# Patient Record
Sex: Female | Born: 1970 | Race: White | Hispanic: No | Marital: Married | Smoking: Never smoker
Health system: Southern US, Community
[De-identification: ages and names within clinical notes are randomized; demographics above are authoritative.]

## PROBLEM LIST (undated history)

## (undated) DIAGNOSIS — T8859XA Other complications of anesthesia, initial encounter: Secondary | ICD-10-CM

---

## 1999-07-09 ENCOUNTER — Other Ambulatory Visit: Admission: RE | Admit: 1999-07-09 | Discharge: 1999-07-09 | Payer: Self-pay | Admitting: Obstetrics and Gynecology

## 1999-09-16 ENCOUNTER — Inpatient Hospital Stay (HOSPITAL_COMMUNITY): Admission: AD | Admit: 1999-09-16 | Discharge: 1999-09-16 | Payer: Self-pay | Admitting: Obstetrics and Gynecology

## 1999-11-01 ENCOUNTER — Other Ambulatory Visit: Admission: RE | Admit: 1999-11-01 | Discharge: 1999-11-01 | Payer: Self-pay | Admitting: Obstetrics and Gynecology

## 2000-01-29 ENCOUNTER — Inpatient Hospital Stay (HOSPITAL_COMMUNITY): Admission: AD | Admit: 2000-01-29 | Discharge: 2000-02-02 | Payer: Self-pay | Admitting: Obstetrics and Gynecology

## 2000-02-03 ENCOUNTER — Encounter: Admission: RE | Admit: 2000-02-03 | Discharge: 2000-04-25 | Payer: Self-pay | Admitting: Obstetrics and Gynecology

## 2000-03-05 ENCOUNTER — Other Ambulatory Visit: Admission: RE | Admit: 2000-03-05 | Discharge: 2000-03-05 | Payer: Self-pay | Admitting: Obstetrics and Gynecology

## 2001-08-27 ENCOUNTER — Other Ambulatory Visit: Admission: RE | Admit: 2001-08-27 | Discharge: 2001-08-27 | Payer: Self-pay | Admitting: Obstetrics and Gynecology

## 2002-12-09 ENCOUNTER — Other Ambulatory Visit: Admission: RE | Admit: 2002-12-09 | Discharge: 2002-12-09 | Payer: Self-pay | Admitting: Obstetrics and Gynecology

## 2003-05-26 ENCOUNTER — Inpatient Hospital Stay (HOSPITAL_COMMUNITY): Admission: AD | Admit: 2003-05-26 | Discharge: 2003-05-26 | Payer: Self-pay | Admitting: Obstetrics and Gynecology

## 2003-07-01 ENCOUNTER — Inpatient Hospital Stay (HOSPITAL_COMMUNITY): Admission: RE | Admit: 2003-07-01 | Discharge: 2003-07-04 | Payer: Self-pay | Admitting: Obstetrics and Gynecology

## 2003-12-01 ENCOUNTER — Other Ambulatory Visit: Admission: RE | Admit: 2003-12-01 | Discharge: 2003-12-01 | Payer: Self-pay | Admitting: Obstetrics and Gynecology

## 2004-11-30 ENCOUNTER — Other Ambulatory Visit: Admission: RE | Admit: 2004-11-30 | Discharge: 2004-11-30 | Payer: Self-pay | Admitting: Obstetrics and Gynecology

## 2005-12-20 ENCOUNTER — Other Ambulatory Visit: Admission: RE | Admit: 2005-12-20 | Discharge: 2005-12-20 | Payer: Self-pay | Admitting: Obstetrics and Gynecology

## 2007-05-17 ENCOUNTER — Inpatient Hospital Stay (HOSPITAL_COMMUNITY): Admission: AD | Admit: 2007-05-17 | Discharge: 2007-05-17 | Payer: Self-pay | Admitting: Obstetrics and Gynecology

## 2007-07-09 ENCOUNTER — Inpatient Hospital Stay (HOSPITAL_COMMUNITY): Admission: RE | Admit: 2007-07-09 | Discharge: 2007-07-12 | Payer: Self-pay | Admitting: Obstetrics and Gynecology

## 2011-02-26 NOTE — Discharge Summary (Signed)
[name], [name]              ACCOUNT NO.:  [id number]   MEDICAL RECORD NO.:  [id number]          PATIENT TYPE:  INP   LOCATION:  9124                          FACILITY:  [facility]   PHYSICIAN:  Janine Limbo, M.D.DATE OF BIRTH:  1971-07-23   DATE OF ADMISSION:  07/09/2007  DATE OF DISCHARGE:  07/12/2007                               DISCHARGE SUMMARY   ADMITTING DIAGNOSES:  1. Intrauterine pregnancy at 53 and 5/7 weeks.  2. Previous Cesarean section x2 with desire for repeat.   DISCHARGE DIAGNOSES:  1. Intrauterine pregnancy at 13 and 5/7 weeks.  2. Previous Cesarean section x2 with desire for repeat.  3. Including incidental cystotomy.   PROCEDURES:  1. Repeat low transverse Cesarean section.  2. Repair of incidental cystotomy.  3. Spinal anesthesia.   HOSPITAL COURSE:  The patient is a 40 year old gravida 3, para 2-0-0-2  at 66 and 5/7 weeks who presented for an elective Cesarean section  secondary to previous C section x2. Pregnancy had been remarkable for 1)  questionable LMP, 2) previous C section x2, 3) HSV, 4) irritable bowel  syndrome, 5) migraines, 6) frequent UTIs, 7) obesity.   The patient was taken to the operating room. A repeat low transverse  Cesarean section was performed by Dr. Estanislado Pandy. There was an incidental  cystotomy that occurred at the time of the surgery. This was repaired by  Dr. Estanislado Pandy and Dr. Pennie Rushing. Findings were a viable female by the name of  [name]. Apgars were 9 and 9, weight was 6 pounds 3 ounces. The  patient did donate her cord blood to the [facility]. The  patient tolerated the procedure well and was taken to the recovery in  good  condition. The patient was taken to the full-term nursery.   By postop day 1 the patient was doing well. She did have some gaseous  distention, but this did improve with ambulation. The Foley was to  remain in place secondary to the incidental cystotomy and was to be  removed on  07/16/07, at 10:30 a.m. in the office. The patient did have  some jaw pain secondary to probable clenching. She had a JP drain that  had about 30 mL in it but was within normal limits. She was doing well  with pain management.   By postop day 2 the patient continued to do well. She was up ad lib.  Pain medication was doing well. Her hemoglobin was 10.8, down from 12.1.  white blood cell count was 15.7 and platelet count was 249. Her physical  exam was within normal limits. Her Foley was draining clear yellow  urine.   Late in the evening of 9/27, the nurse midwife was called to see the  patient secondary to bilateral breast redness and pain. She had no  fever. There was possible early mastitis noted. A decision was made to  start antibiotics and dicloxacillin was begun that evening.   By postop day 3 the patient was doing well. She was up ad lib. Her  breasts were slightly tender but there was minimal erythema noted. They  were full, slightly engorged. Her incision was clean, dry and intact.  Her JP drain was removed without difficulty by Dr. Stefano Gaul. She was  deemed to have received full benefit of her hospital stay and was  discharged home.   DISCHARGE INSTRUCTIONS:  Presented [facility] [facility] handout. The patient is  to also maintain the Foley until removal in the office on 07/16/07, at  10:30 a.m.   DISCHARGE MEDICATIONS:  1. Motrin 600 mg orally q. 6 hour p.r.n., pain.  2. Percocet 5/325 one to two orally q. 3 to 4 hours p.r.n., pain.  3. Dicloxacillin 500 mg 1 orally q. 6 hours x10 days.  4. The patient will decide regarding birth control.  5. Prenatal vitamin 1 orally daily.   DISCHARGE FOLLOWUP:  Will occur on 07/16/07, at 10:30 a.m. with Dr.  Estanislado Pandy for removal of the Foley catheter, 6 weeks post partum, and then  will be the 6-week postop C section will be the routine followup or  p.r.n.      [name], C.N.M.      Janine Limbo, M.D.  Electronically  Signed    VLL/MEDQ  D:  07/12/2007  T:  07/12/2007  Job:  [id number]

## 2011-02-26 NOTE — Op Note (Signed)
NAMEBALJIT, [name]              ACCOUNT NO.:  [id number]   MEDICAL RECORD NO.:  [id number]          PATIENT TYPE:  INP   LOCATION:  9124                          [facility]   PHYSICIAN:  Crist Fat. Rivard, M.D. DATE OF BIRTH:  12/09/1970   DATE OF PROCEDURE:  07/09/2007  DATE OF DISCHARGE:                               OPERATIVE REPORT   PREOPERATIVE DIAGNOSIS:  Intrauterine pregnancy at 38 weeks and 5 days  with two previous cesarean sections.   POSTOP DIAGNOSIS:  Intrauterine pregnancy at 38 weeks and 5 days with  two previous cesarean sections with incidental cystostomy.   ANESTHESIA:  Spinal, Germaine Pomfret, M.D.   PROCEDURE:  Repeat low-transverse cesarean section with repair of  incidental cystostomy.   SURGEON:  Crist Fat. Rivard, M.D.   ASSISTANT:  Maris Berger. Haygood, M.D.   ESTIMATED BLOOD LOSS:  800 mL.   DESCRIPTION OF PROCEDURE:  After being informed of the planned procedure  with possible complications including bleeding, infection, injury to  bladder, bowels or ureter, informed consent is obtained.  The patient is  taken to cesarean suite, given spinal anesthesia without any  complication.  She is placed in the dorsal decubitus position, pelvis  tilted to the left, prepped and draped in a sterile fashion, and a Foley  catheter is inserted in her bladder.   After assessing adequate level of anesthesia, we infiltrated the  previous Pfannenstiel incision using 20 mL of Marcaine 0.25, and we  performed a Pfannenstiel incision which was brought down sharply to the  fascia.  The fascia was incised in a low-transverse fashion.  Linea alba  was dissected.  Peritoneum was entered in the midline fashion.  Visceral  peritoneum was entered in a low-transverse fashion, allowing [place] to  safely retract bladder by developing a bladder flap.  We then entered  the myometrium with knife in a low-transverse fashion, and then we  extended bluntly.  Amniotic fluid was  clear.  We assist the birth of a  female infant at 1405 mouth and nose were suctioned, and the baby is  delivered while reducing a nuchal cord.   The cord is clamped with two Kelly clamps and sectioned, and the baby is  given to Dr. Francine Graven, neonatologist, present in the room.  The  placenta is allowed to deliver spontaneously.  It was complete, the cord  has 3 vessels, and uterine revision was negative.  The placenta was sent  for cord blood donation, and the patient received a dose of Ancef 2  grams IV.   We then proceeded with closure of the myometrium, first with a running  lock suture of #0 Vicryl, then with a Lembert suture of #0 Vicryl,  imbricating three-quarters of the incision.  This second layer had to be  stopped about one 2 cm away from the right angle, due to a finding of a  incidental cystostomy near the myometrial incision.   This cystostomy is 0.5 cm in diameter and we are able to visualize the  internal aspect of it which is not anywhere near the urethral opening.  Using  a 3-0 Vicryl we closed the cystostomy in two layers, first with a  running suture, then with interrupted suture of 3-0 Vicryl.   The bladder was then filled with sterile formula to see that the repair  is intact.  Hemostasis was completed on the myometrial incision, near  the left angle, with a figure-of-eight stitch of #0 Vicryl.  We then  irrigate profusely with warm saline, and note a satisfactory hemostasis.  Both tubes and both ovaries are assessed and adequate.   Under fascia hemostasis was completed with cautery, and the fascia was  then closed with two running sutures of #1 Vicryl meeting midline.  The  wound was irrigated with warm saline.  A #10 Jackson-Pratt was left in  the incision with the left counter incision sutured with a #0 silk.  The  skin was closed with subcuticular suture of 3-0 Monocryl and Steri-  Strips.   Instruments and sponge count was complete x2.  Estimated blood  loss was  800 mL.  The procedure was very well tolerated by the patient who was  taken to the recovery room in a well and stable condition.   Little girl, named [name], was born at 2:05 p.m. received an  Apgar of 9 at one minute and 9 at five minutes, and weighs 6 pounds 3  ounces.   SPECIMEN:  Placenta sent to cord blood donation then to labor and  delivery.      Crist Fat Rivard, M.D.  Electronically Signed     SAR/MEDQ  D:  07/09/2007  T:  07/10/2007  Job:  [id number]

## 2011-02-26 NOTE — H&P (Signed)
[name], [name]              ACCOUNT NO.:  [id number]   MEDICAL RECORD NO.:  [id number]          PATIENT TYPE:  INP   LOCATION:  9124                          [facility]   PHYSICIAN:  Hannah Fat. [name], M.D. DATE OF BIRTH:  19-Apr-1971   DATE OF ADMISSION:  07/09/2007  DATE OF DISCHARGE:                              HISTORY & PHYSICAL   HISTORY AND PHYSICAL:  This is a 40 year old, G3, P2-0-0-2 at 38-5/7  weeks who presents for an elective repeat cesarean section secondary to  previous C-section x2.  Pregnancy has been followed by Dr. Estanislado Pandy and  remarkable for:  1. Unsure LMP.  2. Previous C-section x2.  3. HSV.  4. IBS.  5. Migraines.  6. Frequent UTIs.  7. Obesity.   ALLERGIES:  CODEINE causes loopy feeling.   PAST OBSTETRICAL HISTORY:  The patient had a C-section delivery in 2001,  of a female infant at 31 weeks' gestation weighing 8 pounds 2 ounces  remarkable for decelerations.  She had a C-section delivery in 2004, of  a female infant at 67 weeks' gestation weighing 7 pounds 2 ounces with no  complications.   PAST MEDICAL HISTORY:  1. History of anemia during pregnancy.  2. Blood type is Rh negative.  3. History of HSV.  4. Childhood varicella.  5. History of IBS and frequent bladder infections.  6. The patient was a previous smoker, but quit 10 years ago.   FAMILY HISTORY:  Remarkable for grandfather and sister with  varicosities.  Sister and grandmother with diabetes.  Father with colon  cancer.   GENETIC HISTORY:  Remarkable for a niece with cerebral palsy and a  cousin with Down syndrome.   SOCIAL HISTORY:  The patient is married to [name] who is involved  and supportive.  She does not report a religious affiliation.  She is a  stay at home mom.  She denies any alcohol, tobacco or drug use.   PRENATAL LABORATORY DATA:  Hemoglobin 12.9, platelets 385.  Blood type A  negative, antibody screen negative, RPR nonreactive, rubella immune.  Hepatitis  negative.  HIV negative.  Pap test normal.  Gonorrhea  negative, chlamydia negative.  First trimester screen normal.  Cystic  fibrosis declined.   PRENATAL COURSE:  The patient entered care at [redacted] weeks gestation.  First  trimester screen was normal.  She had an ultrasound at 19 weeks that was  normal, but they were unable to see heart anatomy and she had another  ultrasound at 21 weeks to complete that.  She had a Glucola at 28 weeks  and got RhoGAM at 28 weeks.  Glucola was normal.  She presents today for  C-section.   PHYSICAL EXAMINATION:  VITAL SIGNS:  Stable, afebrile.  HEENT:  Within normal limits.  NECK:  Thyroid normal, not enlarged.  CHEST:  Clear to auscultation.  HEART:  Regular rate and rhythm.  ABDOMEN:  Gravid, vertex to Leopold's.  Cervix deferred.  EXTREMITIES:  Within normal limits.   ASSESSMENT:  1. Intrauterine pregnancy at 38-5/7 weeks.  2. Previous C-section x2.  3. Desires repeat  C-section.   PLAN:  1. Admit to operating room per Dr. Estanislado Pandy.  2. Further orders to follow.      Marie L. Williams, C.N.M.      [name], M.D.  Electronically Signed    MLW/MEDQ  D:  07/09/2007  T:  07/10/2007  Job:  [id number]

## 2011-03-01 NOTE — Op Note (Signed)
[name], [name]                        ACCOUNT NO.:  [id number]   MEDICAL RECORD NO.:  [id number]                   PATIENT TYPE:  INP   LOCATION:  9137                                 [facility]   PHYSICIAN:  Crist Fat. Rivard, M.D.              DATE OF BIRTH:  1971/01/09   DATE OF PROCEDURE:  07/01/2003  DATE OF DISCHARGE:                                 OPERATIVE REPORT   PREOPERATIVE DIAGNOSES:  1. Intrauterine pregnancy at 39 weeks'.  2. Previous cesarean section.   POSTOPERATIVE DIAGNOSES:  1. Intrauterine pregnancy at 39 weeks'.  2. Previous cesarean section.   ANESTHESIA:  Spinal.   ANESTHESIOLOGIST:  Dr. Jean Rosenthal.   PROCEDURE:  Repeat low transverse cesarean section.   SURGEON:  Crist Fat. Rivard, M.D.   ASSISTANT:  Elby Showers. Williams, C.N.M.   ESTIMATED BLOOD LOSS:  700 mL.   PROCEDURE:  After being informed of the planned procedures with possible  complications including bleeding, infection, injury to bowels, bladder, or  ureters, informed consent was obtained.  The patient is taken to cesarean  suite and given spinal anesthesia without any complication.  She is prepped  and draped in a sterile fashion and a Foley catheter is inserted in her  bladder.  She is placed in the dorsal decubitus position, pelvis tilted to  the left.   After assessing adequate level of anesthesia, the previous incision is  infiltrated with 20 mL of 0.25% Marcaine and we proceed with the  Pfannenstiel incision which is brought down to the fascia.  The fascia is  incised in a low transverse fashion, the linea alba is dissected, peritoneum  is entered in a midline fashion.  The visceral peritoneum is entered in a  low transverse fashion allowing [place] to safely retract bladder by developing a  bladder flap.  Myometrium is then entered in a low transverse fashion, first  with knife and then bluntly.  Amniotic fluid is clear.  We assist the birth  of a female infant with vacuum  assistance in vertex presentation at 9:45 a.m.  Mouth and nose are suctioned, baby is delivered, cord is clamped with two  Kelly clamps and sectioned, and the baby is given to the pediatrician  present in the room.  Twenty mL of blood is drawn from the umbilical vein  and the placenta is allowed to delivery spontaneously.  It is complete and  cord has three vessels.  Uterine revision is negative.  We then proceed with  closure of the myometrium in two layers, first with a running lock suture of  0 Vicryl, then with a Lembert suture of 0 Vicryl covering the first one.  Hemostasis is completed at each angle with two figure-of-eight stitches of 0  Vicryl.  Hemostasis is assessed and adequate.  Both paracolic gutters are  cleansed.  Both tubes and ovaries assessed and normal.  The pelvis is  irrigated with  warm saline and hemostasis is adequate.  Under fascia  hemostasis is completed with cautery and the fascia is closed with two  running sutures of 0 Vicryl meeting midline.  Wound is then irrigated with  warm saline, hemostasis is completed with cautery, and the skin is closed  with staples.   A little boy names [name] was born at 9:41, received an Apgar of 8 at  one minute and 9 at five minutes, and weighed 7 pounds 2 ounces.  Instrument  and sponge count is complete x2.  Estimated blood loss is 700 mL.  The  procedure is very well tolerated by the patient who is taken to the recovery  room in a well and stable condition.                                               Crist Fat Rivard, M.D.    SAR/MEDQ  D:  07/01/2003  T:  07/02/2003  Job:  [id number]

## 2011-03-01 NOTE — H&P (Signed)
NAME:  [name], [name]                        ACCOUNT NO.:  [id number]   MEDICAL RECORD NO.:  [id number]                   PATIENT TYPE:  INP   LOCATION:  NA                                   [facility]   PHYSICIAN:  [name], M.D.              DATE OF BIRTH:  Mar 08, 1971   DATE OF ADMISSION:  07/01/2003  DATE OF DISCHARGE:                                HISTORY & PHYSICAL   HISTORY OF PRESENT ILLNESS:  This is a 40 year old gravida 2, para 1-0-0-1  at 39-0/7 weeks, who presents for a scheduled elective repeat cesarean  section.   PRESENT PREGNANCY:  Followed by Dr. Estanislado [name] and remarkable for:  1. Abnormal LMP.  2. History of irritable bowel syndrome.  3. History of pyelonephritis.  4. Previous cesarean section, desires repeat.  5. History of HSV.  6. Family history of Down's syndrome.  7. History of migraines.   OBSTETRICAL HISTORY:  Primary cesarean section in 2001, of a female infant at  [redacted] weeks gestation; weighing 8 pounds 2 ounces.  Remarkable for postdates,  failure to progress and nuchal cord.   PAST MEDICAL HISTORY:  1. History of anemia with first pregnancy.  2. History of postpartum blues.  3. History of abnormal Pap once, with no further abnormalities.  4. Childhood varicella.  5. History of migraines, which improved after she stopped smoking.   PAST SURGICAL HISTORY:  1. Wisdom teeth extraction in high school.  2. Cesarean section 2001.  3. Cyst removed from back in 1997.   FAMILY HISTORY:  Congestive heart failure in grandmother.  Hypertension in  sister and father.  Aneurysm on paternal side.  Varicosities in father and  sister.  Type 1 diabetes in sister.  Colon cancer in father.  Unknown type  of cancer in grandfather.   GENETIC HISTORY:  Remarkable for the niece of baby's father with cerebral  palsy.  The patient's cousin born with Down syndrome.   SOCIAL HISTORY:  The patient is married to [facility], who is involved  and supportive.   She is of the [place] faith.  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  She denies any alcohol, tobacco or drug use.   PRENATAL LABS:  Hemoglobin 13.2, platelets 336, blood type A negative.  Antibody screen negative.  RPR nonreactive.  Rubella immune.  HbSAG  negative.  HIV nonreactive.  Pap test normal.  Gonorrhea negative.  Chlamydia negative.  Cystic fibrosis negative.  Quad screen within normal  limits.   OBJECTIVE DATA:  VITAL SIGNS:  Stable, afebrile.  HEENT:  Within normal limits.  NECK:  Thyroid normal and not enlarged.  CHEST:  Clear to auscultation.  HEART:  Regular rate and rhythm.  ABDOMEN:  Gravid.  Vertex to Leopold's.  PELVIC:  Deferred.  EXTREMITIES:  Within normal limits.   PREOPERATIVE LABS:  White blood cell count 12.2, hemoglobin 11.9,  platelets  259.  Urinalysis negative.   ASSESSMENT:  1. Intrauterine pregnancy at term.  2. Previous cesarean section.  3. Desires repeat cesarean section.   PLAN:  Admit to OR suite for elective cesarean section, per Dr. Estanislado [name].     [name], C.N.M.                 Crist Fat [name], M.D.    MLW/MEDQ  D:  07/01/2003  T:  07/01/2003  Job:  [id number]

## 2011-03-01 NOTE — Discharge Summary (Signed)
   [name], [name]                        ACCOUNT NO.:  [id number]   MEDICAL RECORD NO.:  [id number]                   PATIENT TYPE:  INP   LOCATION:  9137                                 [facility]   PHYSICIAN:  Naima A. Dillard, M.D.              DATE OF BIRTH:  [birth year]   DATE OF ADMISSION:  07/01/2003  DATE OF DISCHARGE:  07/04/2003                                 DISCHARGE SUMMARY   ADMISSION DIAGNOSES:  1. Intrauterine pregnancy at term.  2. Previous low transverse cesarean section, desiring a repeat.   DISCHARGE DIAGNOSES:  1. Intrauterine pregnancy at term.  2. Previous low transverse cesarean section, desiring a repeat.  3. Status post low transverse cesarean section.  4. Breast feeding.  5. Undecided regarding contraception.   PROCEDURE:  Repeat low transverse cesarean section for delivery of a viable  female infant named [name], who weighed 7 pounds, 2 ounces and had  Apgar's of 8 and 9 on July 01, 2003. Attended in delivery by Dr. Dois Davenport  Rivard and Wynelle Bourgeois, CNM.   HOSPITAL COURSE:  [name] is a 40 year old married white female gravida  2, para 1-0-0-1 at 39-0/7 weeks who was admitted for scheduled elective  repeat cesarean section and underwent the same for delivery of a viable female  infant named [name], who weighed 7 pounds and 2 ounces and had Apgar's  of 8 and 9 on July 01, 2003, attended in delivery by Dr. Dois Davenport Rivard  and Wynelle Bourgeois, CNM. Please see operative note for further details.  Postoperatively, the patient has done well. She is ambulating, voiding and  eating without difficulty. Her vital signs have been stable and she has been  afebrile throughout her postoperative course. She is breast feeding without  difficulty. She is currently undecided regarding contraception. She is  deemed ready for discharge today. Her discharge instruction are as per the  [facility]  handout.   DISCHARGE MEDICATIONS:  1. Motrin 600 mg p.o. q. 6 hours p.r.n. pain.  2. Tylox 1 to 2 p.o. q. 4-6 hours p.r.n. pain.  3. Prenatal vitamins daily.   DISCHARGE LABORATORY DATA:  Hemoglobin 10.5, WBC count is 12.8 and her  platelets are 222,000.   FOLLOW UP:  In 6 weeks at [facility] or p.r.n.     [name], C.N.M.              Naima A. Normand Sloop, M.D.    SJD/MEDQ  D:  07/04/2003  T:  07/04/2003  Job:  [id number]

## 2011-03-01 NOTE — Discharge Summary (Signed)
[facility]  Patient:    [name], [name]                     MRN: [id number] Adm. Date:  [id number] Disc. Date: [id number] Attending:  Silverio Lay A                           Discharge Summary  REASON FOR ADMISSION:         Postdates pregnancy.  HISTORY OF PRESENT ILLNESS:   This is a 40 year old married white female, gravida 1, para 0, with an estimated delivery date by ultrasound of January 14, 2000, being admitted at 41 weeks 1 day to undergo elective induction of labor for postdates  pregnancy.  Her prenatal course was uneventful.  HOSPITAL COURSE:              Upon admission her vaginal examination was 3 cm, 0% effaced, and -3.  She was also known for probable fetal macrosomia.  Artificial  rupture of membranes was done with meconium stained fluid and intrauterine pressure catheter at the same time by Huntley Dec E. Roslyn Smiling, M.D. around 10 a.m. on April 18. Pitocin was then increased according to labor pattern.  At 3:15 p.m. vaginal examination was 3 to 4 cm, 80% effaced, and vertex -1.  The patient was followed closely for labor pattern and adequacy of contractions and at 6:15 p.m. remained at 4 cm, 80%, vertex -1.  At that time, discussion took place on possibility of macrosomia with failure to progress, but because of fetal heart rate being reassuring, we elected to continue with trial of labor.  The patient was reevaluated at 7:30 p.m. with adequate labor pattern with montevideo units well  above 200 and an unchanged vaginal examination.  Failure to progress was then diagnosed and the patient was offered cesarean section.  Both the procedure and  possible complications were reviewed with the patient and husband including infection, bleeding, and trauma to bladder, bowels, and ureters.  In the last two hours of trial of labor, diastolic blood pressure also rose to 84 to 90 and PIH  panel was ordered which was normal.  The patient elected to  wait a little longer with trial of labor and was reevaluated at 9:30 with an unchanged vaginal examination with developing edema of anterior lip and increased molding of the presenting part.  Both the patient and husband then agreed to proceed with primary low transverse cesarean section.  She underwent that procedure with delivery of a female infant weighing 8 pounds 2 ounces with Apgars of 8 and 9 at five minutes. The procedure went without complications with an estimated blood loss of 600 cc. Operative findings included a true knot in the cord with no consequences on fetal heart rate monitoring prior to the procedure.  Postoperative course was uneventful and the patient was discharged on postoperative day #3 or February 02, 2000, in a ell and stable condition with a postoperative hemoglobin of 9.5.  She is to return o the office in four to six weeks for postpartum follow-up and was given Tylox prescription for pain control.  She was instructed to call if experiencing increased bleeding, increased pain, or fever above 100.4.  DISCHARGE DIAGNOSES:          1. Intrauterine pregnancy at 41+ weeks.  2. Failure to progress.                               3. Primary low transverse cesarean section.                               4. Normal postoperative course.DD:  02/18/00 TD:  02/18/00 Job: [id number]

## 2011-07-25 LAB — CBC
HCT: 31.5 — ABNORMAL LOW
HCT: 35 — ABNORMAL LOW
Hemoglobin: 10.8 — ABNORMAL LOW
Hemoglobin: 12.1
MCHC: 34.4
MCV: 88.2
MCV: 88.8
Platelets: 298
RBC: 3.55 — ABNORMAL LOW
RBC: 3.97
RDW: 13.2
WBC: 14.7 — ABNORMAL HIGH

## 2011-07-25 LAB — RH IMMUNE GLOB WKUP(>/=20WKS)(NOT WOMEN'S HOSP): Fetal Screen: NEGATIVE

## 2011-07-25 LAB — CCBB MATERNAL DONOR DRAW

## 2011-07-29 LAB — RH IMMUNE GLOBULIN WORKUP (NOT WOMEN'S HOSP): Antibody Screen: NEGATIVE

## 2012-03-26 ENCOUNTER — Other Ambulatory Visit: Payer: Self-pay

## 2012-03-26 MED ORDER — VALACYCLOVIR HCL 500 MG PO TABS: 500.0000 mg | ORAL_TABLET | Freq: Two times a day (BID) | ORAL | Status: AC

## 2012-03-26 NOTE — Telephone Encounter (Signed)
Valtrex refilled.

## 2012-11-10 ENCOUNTER — Telehealth: Payer: Self-pay | Admitting: Obstetrics and Gynecology

## 2012-11-10 MED ORDER — NORETHIN ACE-ETH ESTRAD-FE 1-20 MG-MCG PO TABS: 1.0000 | ORAL_TABLET | Freq: Every day | ORAL | Status: DC

## 2012-11-10 NOTE — Telephone Encounter (Signed)
TC to pt.  States can obtain Junel Fe (generic Loestrin) for free if prescribed 28 d. Due to start next pack today.  Rx RF until 12/2012 appt.

## 2012-11-10 NOTE — Telephone Encounter (Signed)
VM from pt. Requests Rx be changed to generic-Junel Fe 28 to be free under new health insurance. Has appt 12/2012.  Pt [phone]  [facility]

## 2012-12-13 ENCOUNTER — Other Ambulatory Visit: Payer: Self-pay | Admitting: Obstetrics and Gynecology

## 2012-12-14 NOTE — Telephone Encounter (Signed)
Needs AEX 

## 2013-01-20 ENCOUNTER — Other Ambulatory Visit: Payer: Self-pay | Admitting: Obstetrics and Gynecology

## 2013-01-20 DIAGNOSIS — Z1231 Encounter for screening mammogram for malignant neoplasm of breast: Secondary | ICD-10-CM

## 2013-02-19 ENCOUNTER — Ambulatory Visit: Payer: Self-pay

## 2013-02-19 ENCOUNTER — Ambulatory Visit
Admission: RE | Admit: 2013-02-19 | Discharge: 2013-02-19 | Disposition: A | Discharge status: Home / Self Care | Payer: 59 | Source: Ambulatory Visit | Attending: Obstetrics and Gynecology | Admitting: Obstetrics and Gynecology

## 2013-02-19 DIAGNOSIS — Z1231 Encounter for screening mammogram for malignant neoplasm of breast: Secondary | ICD-10-CM

## 2013-02-19 IMAGING — MG MM DIGITAL SCREENING BILAT W/ CAD
4 series · 4 of 4 positions shown · non-contrast
Comparison: None.

CLINICAL DATA: Screening.

DIGITAL BILATERAL SCREENING MAMMOGRAM WITH CAD

[R CC]
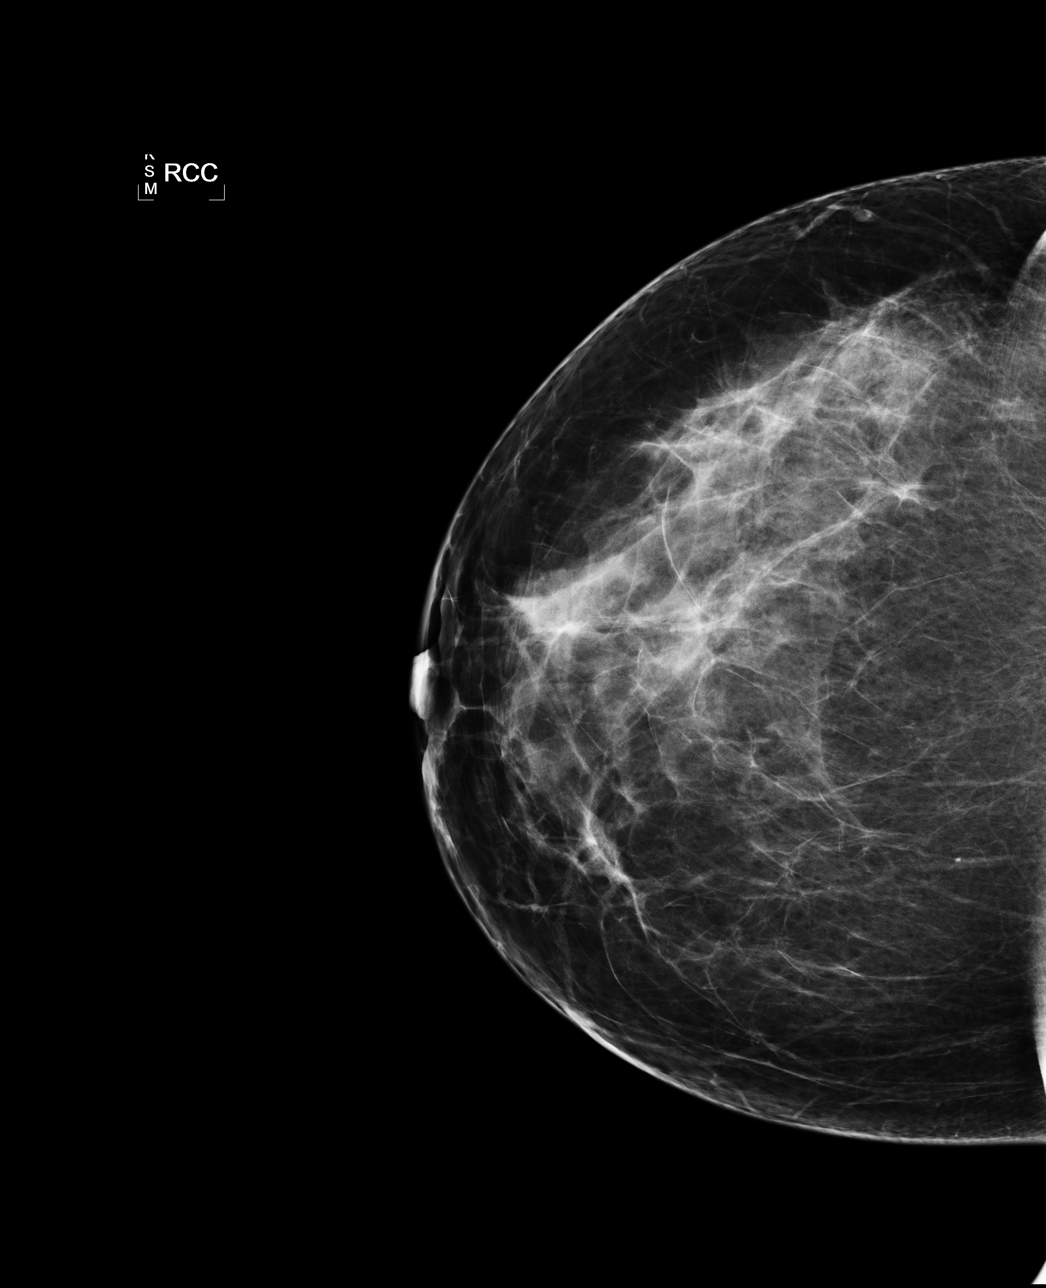

[L CC]
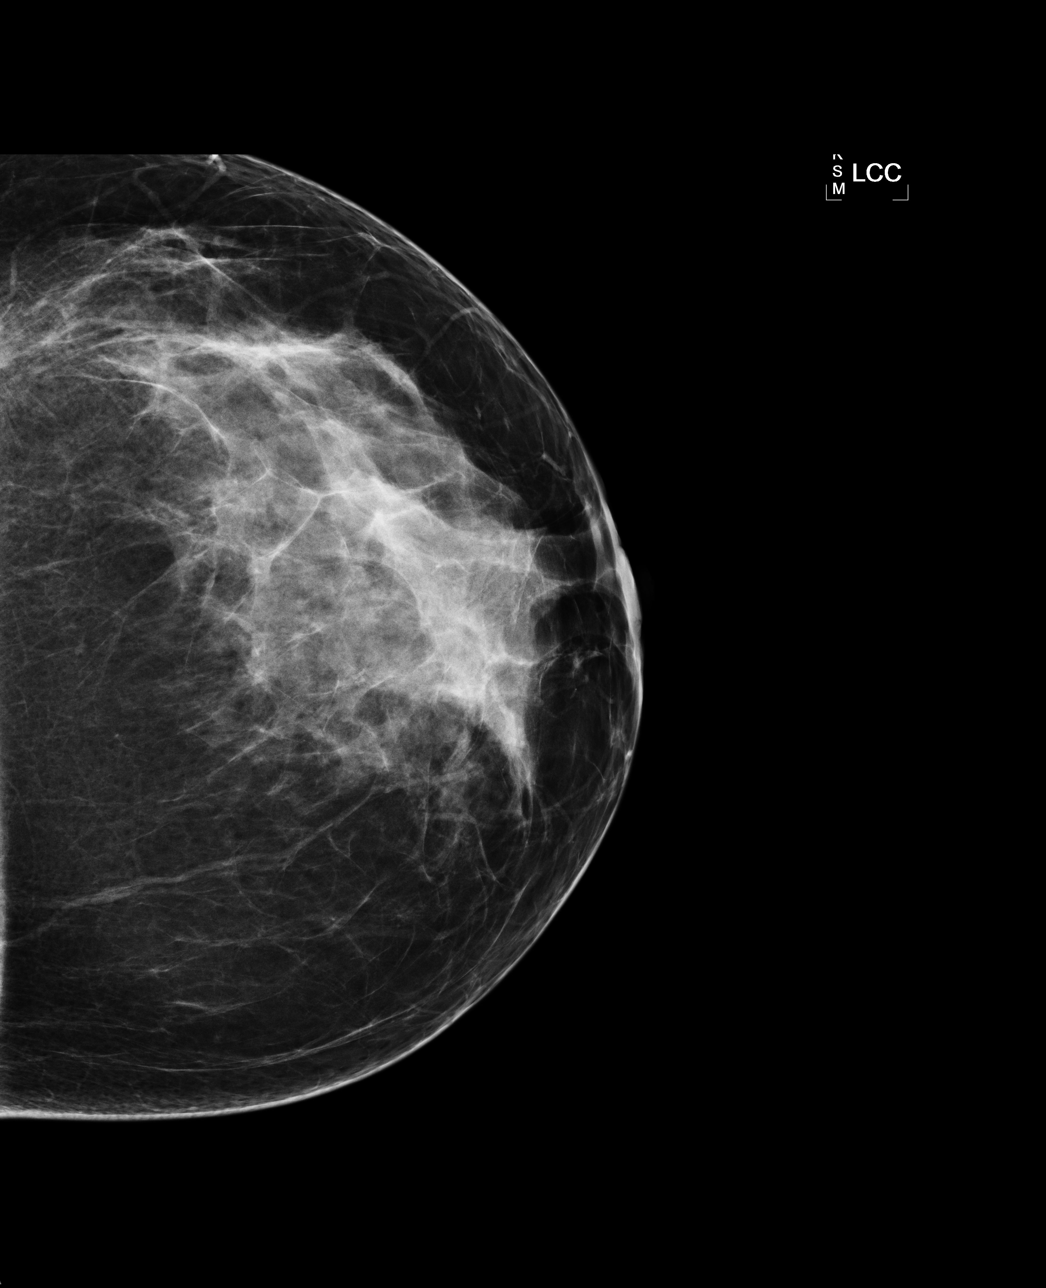

[L MLO]
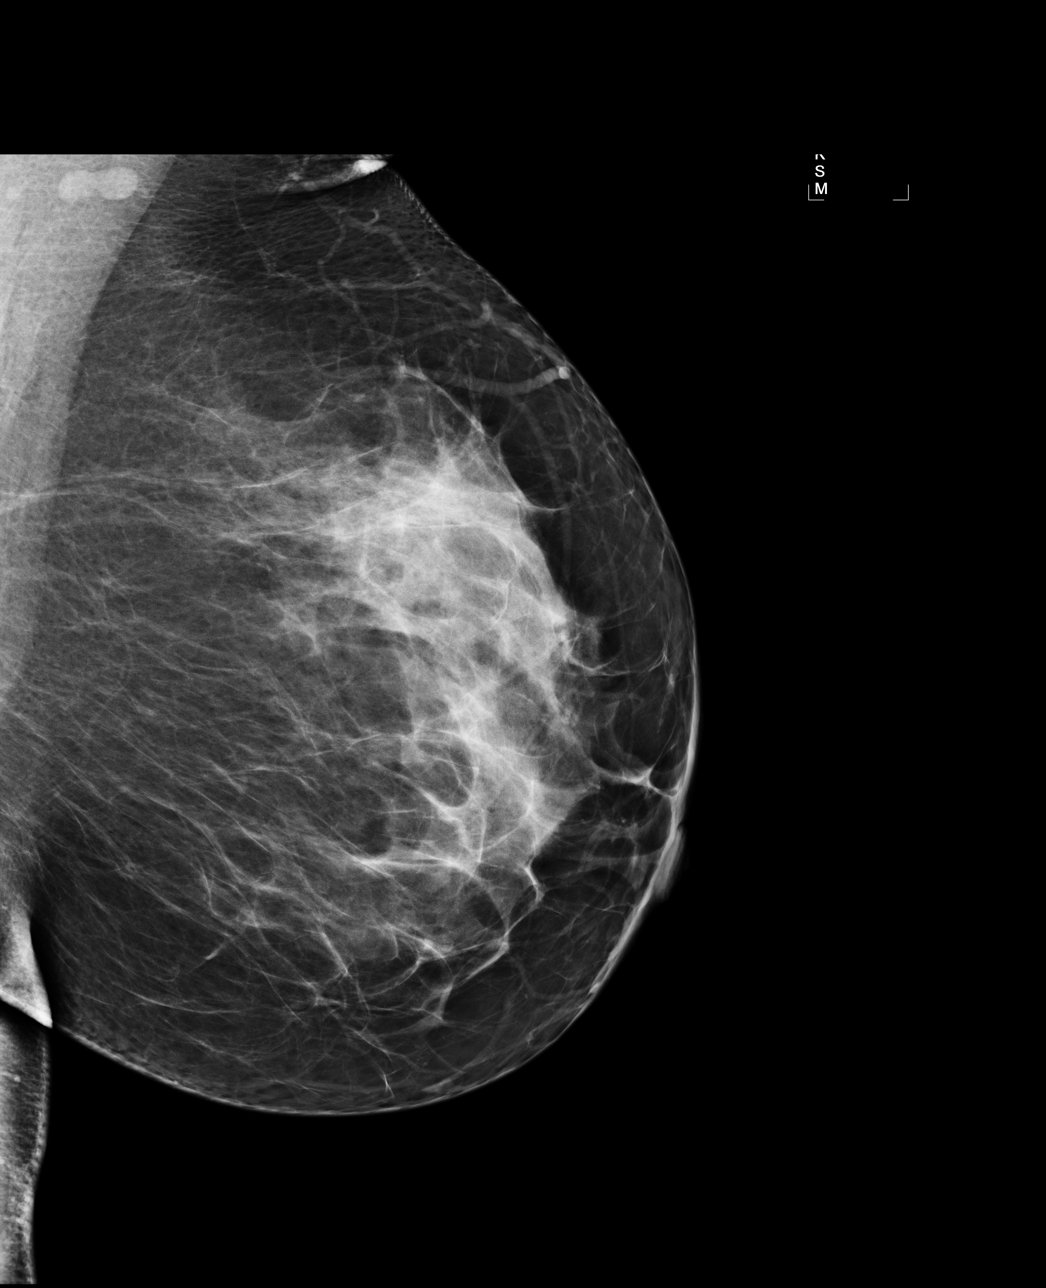

[R MLO]
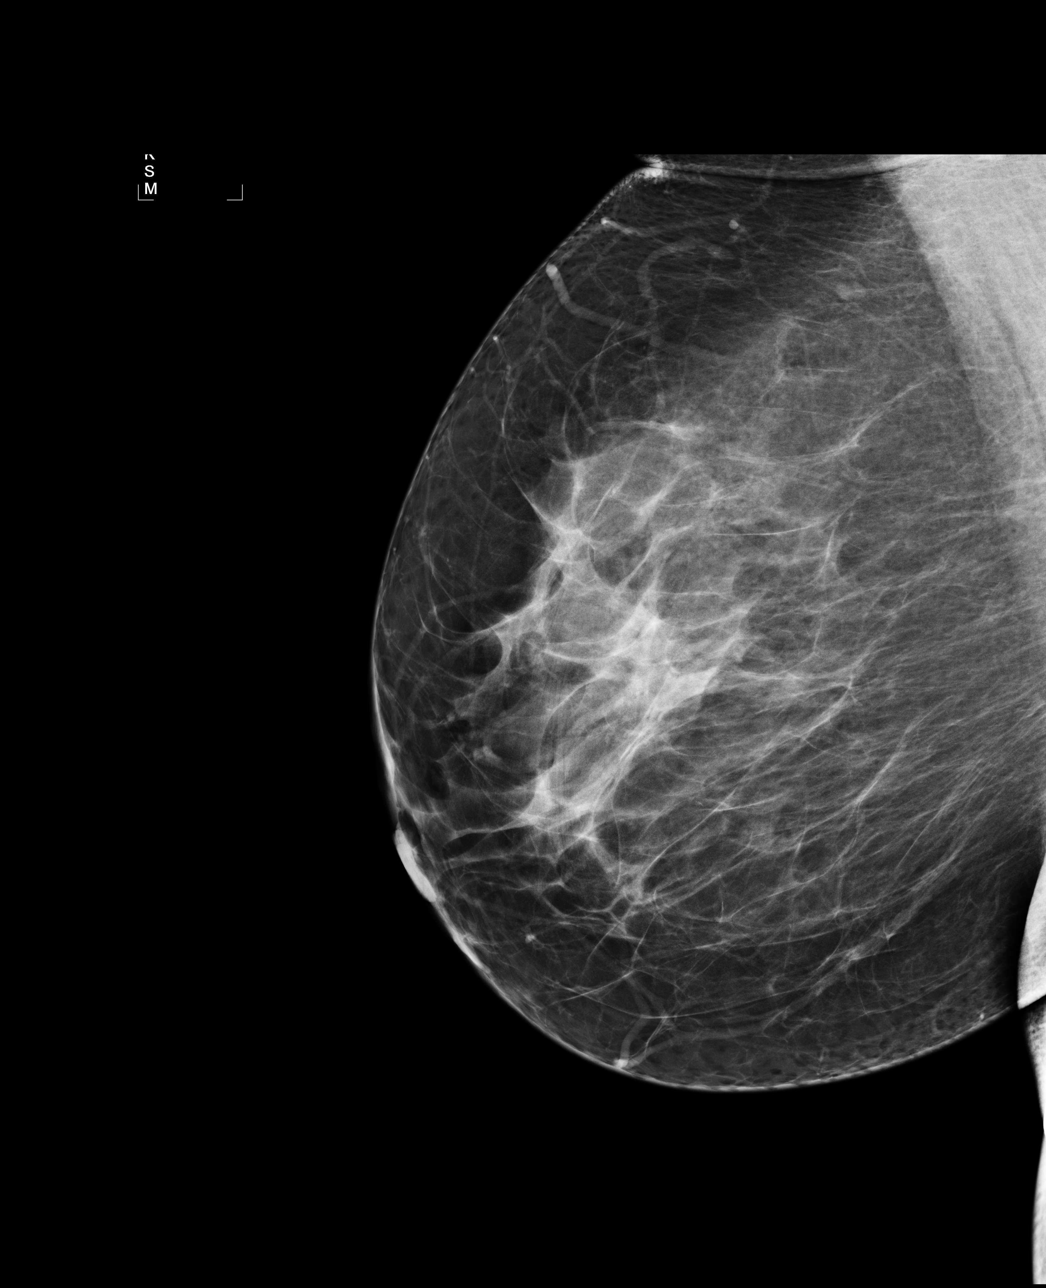

[4 of 4 positions shown; findings below may reference images not displayed]

FINDINGS: ACR Breast Density Category 3: The breast tissue is heterogeneously
dense.

No suspicious masses, architectural distortion, or calcifications
are present.

Images were processed with CAD.
IMPRESSION: No mammographic evidence of malignancy.

A result letter of this screening mammogram will be mailed directly
to the patient.

RECOMMENATION:
Screening mammogram in one year. (Code:3B-M-1Q8)

BI-RADS CATEGORY 1:  Negative.

## 2013-11-25 ENCOUNTER — Emergency Department (HOSPITAL_COMMUNITY)
Admission: EM | Admit: 2013-11-25 | Discharge: 2013-11-25 | Disposition: A | Discharge status: Home / Self Care | Payer: 59 | Attending: Emergency Medicine | Admitting: Emergency Medicine

## 2013-11-25 ENCOUNTER — Encounter (HOSPITAL_COMMUNITY): Payer: Self-pay | Admitting: Emergency Medicine

## 2013-11-25 ENCOUNTER — Emergency Department (HOSPITAL_COMMUNITY): Payer: 59

## 2013-11-25 DIAGNOSIS — F411 Generalized anxiety disorder: Secondary | ICD-10-CM | POA: Insufficient documentation

## 2013-11-25 DIAGNOSIS — R079 Chest pain, unspecified: Secondary | ICD-10-CM

## 2013-11-25 DIAGNOSIS — S43499A Other sprain of unspecified shoulder joint, initial encounter: Secondary | ICD-10-CM | POA: Insufficient documentation

## 2013-11-25 DIAGNOSIS — R030 Elevated blood-pressure reading, without diagnosis of hypertension: Secondary | ICD-10-CM | POA: Insufficient documentation

## 2013-11-25 DIAGNOSIS — S46819A Strain of other muscles, fascia and tendons at shoulder and upper arm level, unspecified arm, initial encounter: Secondary | ICD-10-CM

## 2013-11-25 DIAGNOSIS — Y939 Activity, unspecified: Secondary | ICD-10-CM | POA: Insufficient documentation

## 2013-11-25 DIAGNOSIS — R0789 Other chest pain: Secondary | ICD-10-CM | POA: Insufficient documentation

## 2013-11-25 DIAGNOSIS — R209 Unspecified disturbances of skin sensation: Secondary | ICD-10-CM | POA: Insufficient documentation

## 2013-11-25 DIAGNOSIS — Y929 Unspecified place or not applicable: Secondary | ICD-10-CM | POA: Insufficient documentation

## 2013-11-25 DIAGNOSIS — M25519 Pain in unspecified shoulder: Secondary | ICD-10-CM | POA: Insufficient documentation

## 2013-11-25 DIAGNOSIS — X58XXXA Exposure to other specified factors, initial encounter: Secondary | ICD-10-CM | POA: Insufficient documentation

## 2013-11-25 DIAGNOSIS — T148XXA Other injury of unspecified body region, initial encounter: Secondary | ICD-10-CM

## 2013-11-25 LAB — CBC
HCT: 42 % (ref 36.0–46.0)
Hemoglobin: 14.9 g/dL (ref 12.0–15.0)
MCH: 30.7 pg (ref 26.0–34.0)
MCHC: 35.5 g/dL (ref 30.0–36.0)
MCV: 86.6 fL (ref 78.0–100.0)
PLATELETS: 368 10*3/uL (ref 150–400)
RBC: 4.85 MIL/uL (ref 3.87–5.11)
RDW: 12.7 % (ref 11.5–15.5)
WBC: 14.3 10*3/uL — AB (ref 4.0–10.5)

## 2013-11-25 LAB — BASIC METABOLIC PANEL
BUN: 10 mg/dL (ref 6–23)
CALCIUM: 9.5 mg/dL (ref 8.4–10.5)
CO2: 24 meq/L (ref 19–32)
Chloride: 104 mEq/L (ref 96–112)
Creatinine, Ser: 0.5 mg/dL (ref 0.50–1.10)
GFR calc non Af Amer: >90 mL/min (ref >90)
Glucose, Bld: 104 mg/dL — ABNORMAL HIGH (ref 70–99)
Potassium: 4.3 mEq/L (ref 3.7–5.3)
SODIUM: 142 meq/L (ref 137–147)

## 2013-11-25 LAB — POCT I-STAT TROPONIN I: TROPONIN I, POC: 0.01 ng/mL (ref 0.00–0.08)

## 2013-11-25 LAB — PRO B NATRIURETIC PEPTIDE: PRO B NATRI PEPTIDE: 20.8 pg/mL (ref 0–125)

## 2013-11-25 IMAGING — CR DG CHEST 2V
2 series · 2 of 2 positions shown · non-contrast
Comparison: None.

CLINICAL DATA: Right-sided chest pain

EXAM:
CHEST  2 VIEW

[w chest lat]
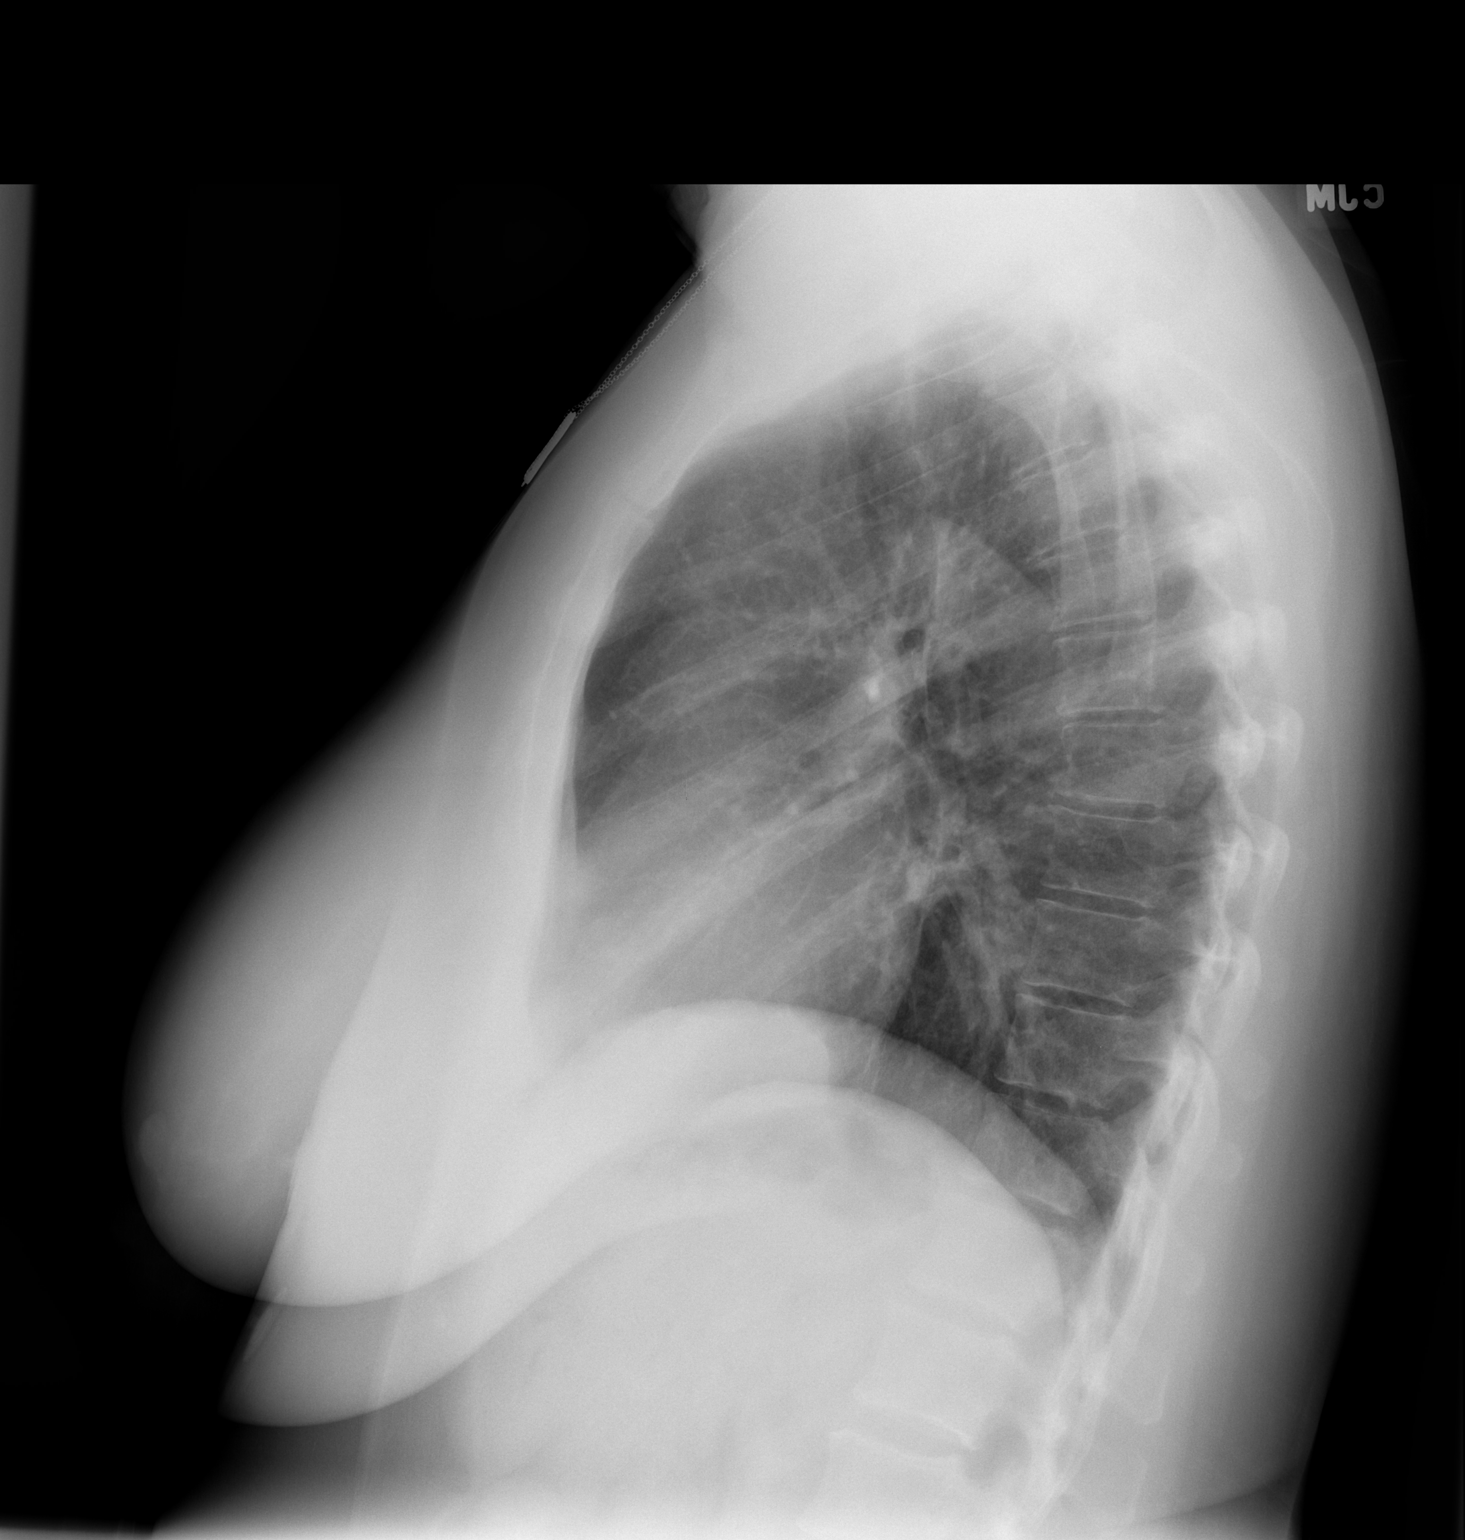

[w chest pa]
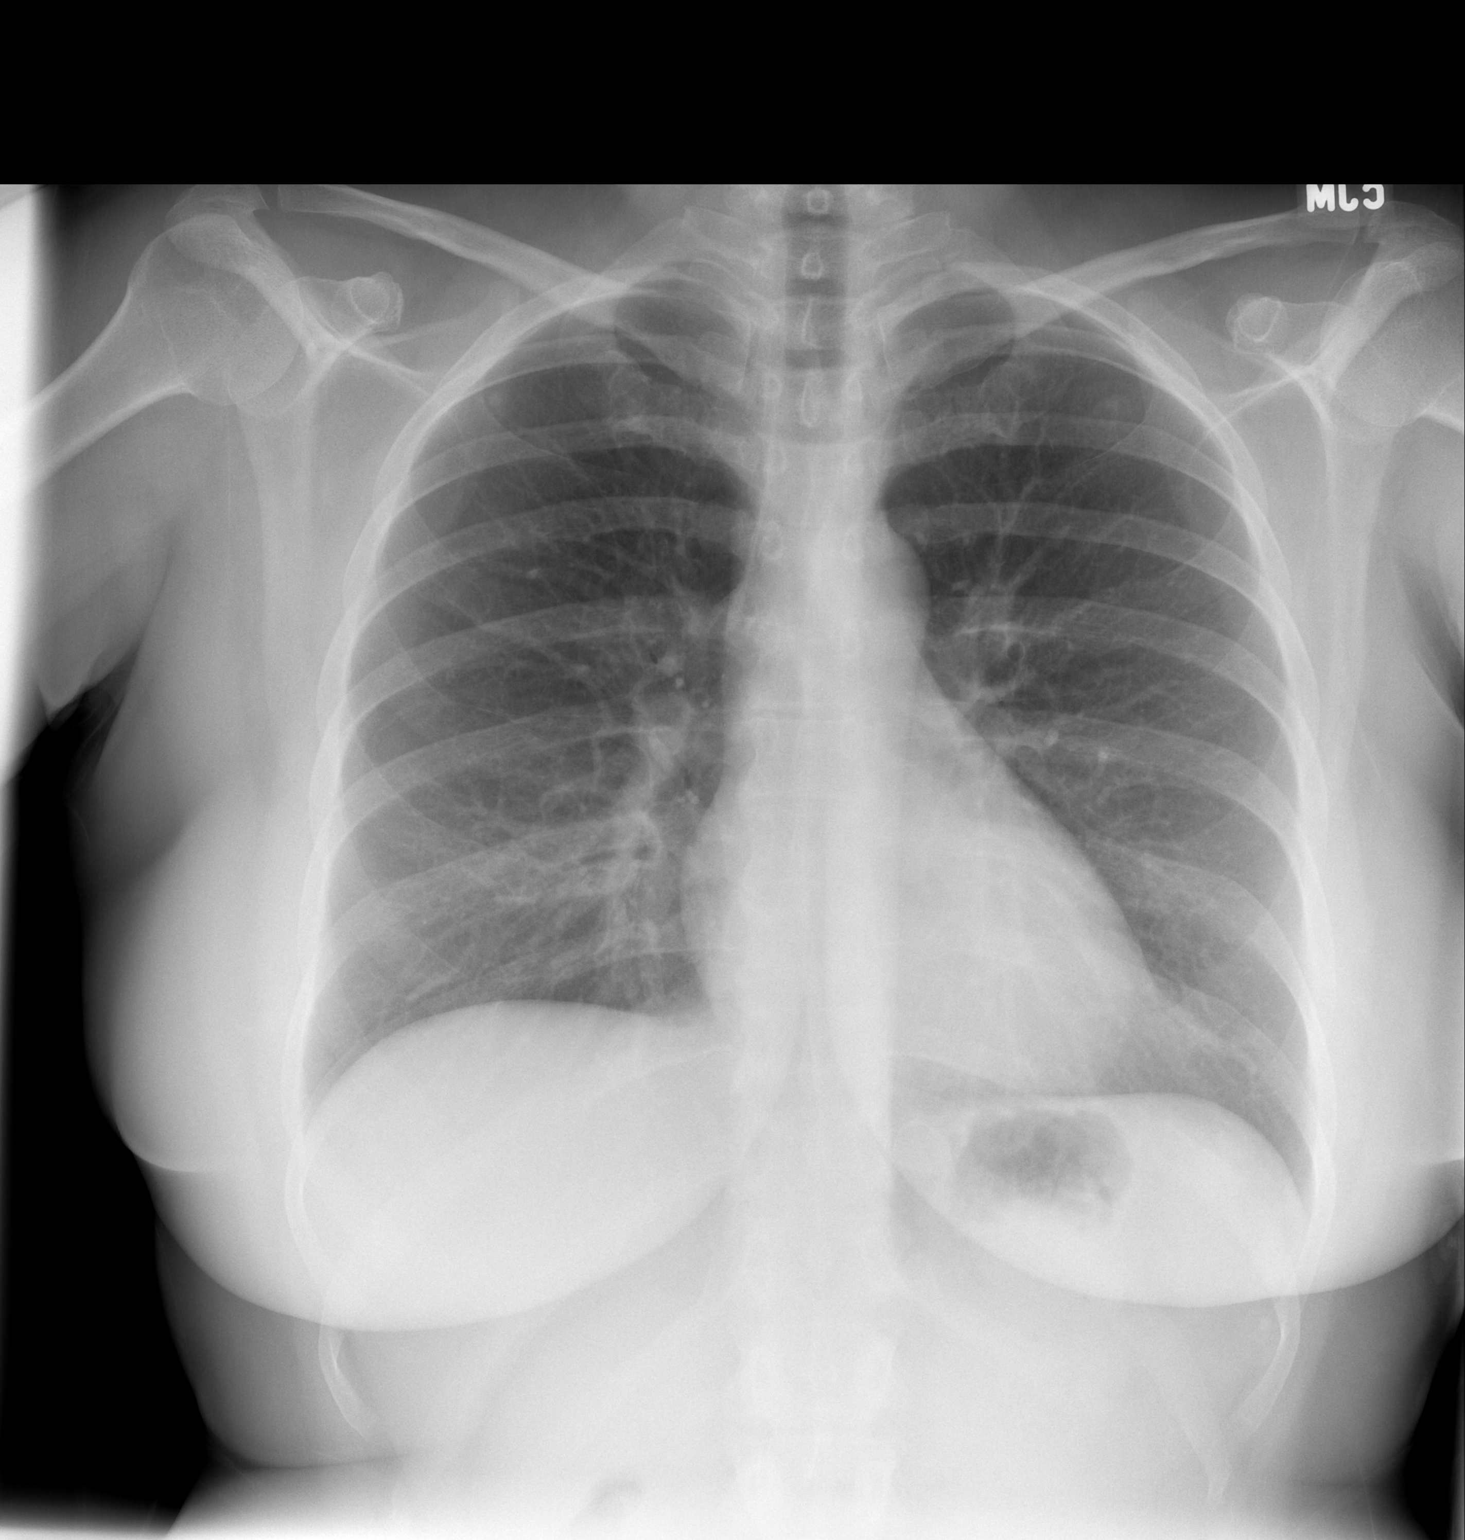

[2 of 2 positions shown; findings below may reference images not displayed]

FINDINGS: The heart size and mediastinal contours are within normal limits.
Both lungs are clear. The visualized skeletal structures are
unremarkable. Leads are noted over the apices bilaterally.
IMPRESSION: No acute abnormality seen.

## 2013-11-25 MED ORDER — IBUPROFEN 800 MG PO TABS
800.0000 mg | ORAL_TABLET | Freq: Once | ORAL | Status: AC
  Administered 2013-11-25: 800 mg via ORAL
  Filled 2013-11-25: qty 1

## 2013-11-25 NOTE — ED Notes (Addendum)
Pt reports waking up this am with sharp pain to right shoulder blade, thought it was possible pulled muscle and then later noticed right side chest pain, increases with inspiration. Also reports that her right arm "feels funny." Reports recent non productive cough. Airway is intact, pt anxious and tearful at triage. ekg done.

## 2013-11-25 NOTE — Discharge Instructions (Signed)
Chest Pain (Nonspecific) °It is often hard to give a specific diagnosis for the cause of chest pain. There is always a chance that your pain could be related to something serious, such as a heart attack or a blood clot in the lungs. You need to follow up with your caregiver for further evaluation. °CAUSES  °· Heartburn. °· Pneumonia or bronchitis. °· Anxiety or stress. °· Inflammation around your heart (pericarditis) or lung (pleuritis or pleurisy). °· A blood clot in the lung. °· A collapsed lung (pneumothorax). It can develop suddenly on its own (spontaneous pneumothorax) or from injury (trauma) to the chest. °· Shingles infection (herpes zoster virus). °The chest wall is composed of bones, muscles, and cartilage. Any of these can be the source of the pain. °· The bones can be bruised by injury. °· The muscles or cartilage can be strained by coughing or overwork. °· The cartilage can be affected by inflammation and become sore (costochondritis). °DIAGNOSIS  °Lab tests or other studies, such as X-rays, electrocardiography, stress testing, or cardiac imaging, may be needed to find the cause of your pain.  °TREATMENT  °· Treatment depends on what may be causing your chest pain. Treatment may include: °· Acid blockers for heartburn. °· Anti-inflammatory medicine. °· Pain medicine for inflammatory conditions. °· Antibiotics if an infection is present. °· You may be advised to change lifestyle habits. This includes stopping smoking and avoiding alcohol, caffeine, and chocolate. °· You may be advised to keep your head raised (elevated) when sleeping. This reduces the chance of acid going backward from your stomach into your esophagus. °· Most of the time, nonspecific chest pain will improve within 2 to 3 days with rest and mild pain medicine. °HOME CARE INSTRUCTIONS  °· If antibiotics were prescribed, take your antibiotics as directed. Finish them even if you start to feel better. °· For the next few days, avoid physical  activities that bring on chest pain. Continue physical activities as directed. °· Do not smoke. °· Avoid drinking alcohol. °· Only take over-the-counter or prescription medicine for pain, discomfort, or fever as directed by your caregiver. °· Follow your caregiver's suggestions for further testing if your chest pain does not go away. °· Keep any follow-up appointments you made. If you do not go to an appointment, you could develop lasting (chronic) problems with pain. If there is any problem keeping an appointment, you must call to reschedule. °SEEK MEDICAL CARE IF:  °· You think you are having problems from the medicine you are taking. Read your medicine instructions carefully. °· Your chest pain does not go away, even after treatment. °· You develop a rash with blisters on your chest. °SEEK IMMEDIATE MEDICAL CARE IF:  °· You have increased chest pain or pain that spreads to your arm, neck, jaw, back, or abdomen. °· You develop shortness of breath, an increasing cough, or you are coughing up blood. °· You have severe back or abdominal pain, feel nauseous, or vomit. °· You develop severe weakness, fainting, or chills. °· You have a fever. °THIS IS AN EMERGENCY. Do not wait to see if the pain will go away. Get medical help at once. Call your local emergency services (911 in [place] Do not drive yourself to the hospital. °MAKE SURE YOU:  °· Understand these instructions. °· Will watch your condition. °· Will get help right away if you are not doing well or get worse. °Document Released: 07/10/2005 Document Revised: 12/23/2011 Document Reviewed: 05/05/2008 °[employer]® Patient Information ©2014 [employer]. ° °

## 2013-11-25 NOTE — ED Notes (Signed)
Pt reports when she woke up she started to feel posterior right shoulder pain. Then later on she felt pain in right upper chest with a deep inspiration. Then later on her right arm started to feel tingling. Pt sts sometimes when she pushes on the location she feels pain too. Pain still there with deep breath but the tingling is gone. Nad, skin warm and dry, resp e/u.

## 2013-11-26 NOTE — ED Provider Notes (Signed)
CSN: [id number]     Arrival date & time 11/25/13  1327 History   First MD Initiated Contact with Patient 11/25/13 1543     Chief Complaint  Patient presents with  . Chest Pain  . Back Pain     (Consider location/radiation/quality/duration/timing/severity/associated sxs/prior Treatment) HPI Comments: 43 yo F no PMHx presents with CC chest pain, shoulder pain.  Pt states symptoms started this AM.  Around 6 AM she noticed ache in right shoulder/upper back she states felt like muscle ache, nonradiating, relieved by rest, worse with movement.  Pain was intermittent throughout the day.  Around 12 PM pt started having some pain in right upper chest, worse with inspiration, described as ache, nonradiating, constant.  This lasted approximately one hour and resolved on its own.  Pt became anxious, and thought she may be having an MI as she has heard women can present atypically.  She started having some tingling in her right arm as well, lasting only a few seconds, but is uncertain of whether this was 2/2 anxiety about her chest pain.  Pt did not take anything for pain.  Denies any trauma.  Denies previous occurrence.  Pt came to ED for further eval.  Currently pt is chest pain free.  States she has some right shoulder pain with movement still.  No other complaints currently.  No cardiac risk factors including no hx of HTN, HLD, DMII, CAD, FHx of CAD, smoking, or obesity.  Patient is a 43 y.o. female presenting with chest pain and back pain. The history is provided by the patient.  Chest Pain Pain location:  R chest Pain quality: aching   Pain radiates to:  Does not radiate Pain radiates to the back: no   Pain severity:  Mild Onset quality:  Gradual Duration:  1 hour Timing:  Intermittent Progression:  Resolved Chronicity:  New Context: lifting, movement and raising an arm   Context: not breathing, not at rest, no stress and no trauma   Relieved by:  Rest Worsened by:  Movement and deep  breathing Ineffective treatments:  None tried Associated symptoms: back pain   Associated symptoms: no abdominal pain, no cough, no dizziness, no fever, no headache, no nausea, no numbness, no palpitations, no shortness of breath, not vomiting and no weakness   Associated symptoms comment:  Right upper back pain Risk factors: no birth control, no coronary artery disease, no diabetes mellitus, no high cholesterol, no hypertension, no immobilization, not obese, not pregnant and no prior DVT/PE   Back Pain Associated symptoms: chest pain   Associated symptoms: no abdominal pain, no fever, no headaches, no numbness and no weakness     History reviewed. No pertinent past medical history. History reviewed. No pertinent past surgical history. History reviewed. No pertinent family history. History  Substance Use Topics  . Smoking status: Not on file  . Smokeless tobacco: Not on file  . Alcohol Use: No   OB History   Grav Para Term Preterm Abortions TAB SAB Ect Mult Living                 Review of Systems  Constitutional: Negative for fever and chills.  Respiratory: Negative for cough and shortness of breath.   Cardiovascular: Positive for chest pain. Negative for palpitations and leg swelling.  Gastrointestinal: Negative for nausea, vomiting, abdominal pain and constipation.  Musculoskeletal: Positive for back pain. Negative for myalgias.  Skin: Negative for rash.  Neurological: Negative for dizziness, weakness, light-headedness, numbness and headaches.  Hematological: Negative for adenopathy. Does not bruise/bleed easily.  All other systems reviewed and are negative.      Allergies  Codeine  Home Medications   Current Outpatient Rx  Name  Route  Sig  Dispense  Refill  . IBUPROFEN PO   Oral   Take 2 tablets by mouth daily as needed (headache).         Marland Kitchen. levonorgestrel (MIRENA) 20 MCG/24HR IUD   Intrauterine   1 each by Intrauterine route once.         .  Pseudoephedrine-Ibuprofen 30-200 MG TABS   Oral   Take 1 tablet by mouth daily as needed (sinus).          BP 141/97  Pulse 83  Temp(Src) 98 F (36.7 C) (Oral)  Resp 16  Ht 5\' 2"  (1.575 m)  Wt 170 lb (77.111 kg)  BMI 31.09 kg/m2  SpO2 97% Physical Exam  Nursing note and vitals reviewed. Constitutional: She is oriented to person, place, and time. She appears well-developed and well-nourished.  HENT:  Head: Normocephalic and atraumatic.  Eyes: Conjunctivae and EOM are normal. Pupils are equal, round, and reactive to light.  Neck: Normal range of motion. Neck supple.  Cardiovascular: Normal rate, regular rhythm and intact distal pulses.  Exam reveals friction rub. Exam reveals no gallop.   No murmur heard. Pulmonary/Chest: Effort normal and breath sounds normal. No respiratory distress. She has no wheezes. She has no rales. She exhibits no tenderness.  Abdominal: Soft. Bowel sounds are normal. She exhibits no distension and no mass. There is no tenderness. There is no rebound and no guarding.  Musculoskeletal: Normal range of motion. She exhibits tenderness. She exhibits no edema.  Mild trapezius TTP.  Worse with active ROM of right arm.    Neurological: She is alert and oriented to person, place, and time.  CNs grossly intact.  No gross sensory or motor deficits.  No weakness in bilaterally upper extremities.  Skin: Skin is warm and dry.  Psychiatric:  Pt anxious on initial questioning, tearful, stating "I have babies, and just want to make sure I'm healthy".    ED Course  Procedures (including critical care time) Labs Review Labs Reviewed  CBC - Abnormal; Notable for the following:    WBC 14.3 (*)    All other components within normal limits  BASIC METABOLIC PANEL - Abnormal; Notable for the following:    Glucose, Bld 104 (*)    All other components within normal limits  PRO B NATRIURETIC PEPTIDE  POCT I-STAT TROPONIN I   Imaging Review Dg Chest 2 View  11/25/2013    CLINICAL DATA:  Right-sided chest pain  EXAM: CHEST  2 VIEW  COMPARISON:  None.  FINDINGS: The heart size and mediastinal contours are within normal limits. Both lungs are clear. The visualized skeletal structures are unremarkable. Leads are noted over the apices bilaterally.  IMPRESSION: No acute abnormality seen.   Electronically Signed   By: Alcide CleverMark  Lukens M.D.   On: 11/25/2013 14:19    EKG Interpretation   None       MDM   Final diagnoses:  Shoulder pain  Chest pain  Muscle strain   43 yo F no PMHx presents with CC chest pain, shoulder pain.   Filed Vitals:   11/25/13 1650  BP: 141/97  Pulse: 83  Temp:   Resp: 16   Mild hypertension 151/96, but VS otherwise WNL.  Exam reveals R trapezius TTP.  No chest wall TTP.  Lungs CTAB.  Some anxiety all initial exam, which improved.  EKG NSR, nonspecific T wave abnormality, Vent rate 86, PR interval 136, QRS 76, QT/QTc 386/461.  CXR WNL.  Labs show normal CBC, BMP, and Troponin of 0.01.  BNP 20.8.  Pt is low risk for ACS, including no hx of HTN, HLD, DMII, CAD, FHx of CAD, smoking, or obesity.  Pt is PERC negative, unlikely PE.  I believe symptoms are likely reflect more MSK origin, with likely some element of anxiety.  Pt given motrin in ED for shoulder pain.  Pt to be d/c home in good condition.  Encouraged to continue supportive care, motrin for pain.  F/u with PCP in 1 week.  Return precautions given.  Pt understands and agrees with plan.  I have discussed pt's care plan with Dr. Rubin Payor.  Jon Gills, MD    Jon Gills, MD 11/26/13 [phone]

## 2013-11-27 NOTE — ED Provider Notes (Signed)
I saw and evaluated the patient, reviewed the resident's note and I agree with the findings and plan and agree with their ECG interpretation.  Patient with chest pain. EKG and lab work reassuring. Doubt pulmonary embolism. May be musculoskeletal. Discharge home  Juliet Rudeathan R. Rubin PayorPickering, MD 11/27/13 1524

## 2015-01-17 ENCOUNTER — Other Ambulatory Visit: Payer: Self-pay

## 2015-01-17 DIAGNOSIS — Z1231 Encounter for screening mammogram for malignant neoplasm of breast: Secondary | ICD-10-CM

## 2015-01-23 ENCOUNTER — Ambulatory Visit
Admission: RE | Admit: 2015-01-23 | Discharge: 2015-01-23 | Disposition: A | Discharge status: Home / Self Care | Payer: 59 | Source: Ambulatory Visit

## 2015-01-23 DIAGNOSIS — Z1231 Encounter for screening mammogram for malignant neoplasm of breast: Secondary | ICD-10-CM

## 2015-01-23 IMAGING — MG MM DIGITAL SCREENING BILAT W/ TOMO W/ CAD
12 series · 12 of 28 positions shown · non-contrast
Comparison: Previous exam(s).

CLINICAL DATA: Screening.

EXAM:
DIGITAL SCREENING BILATERAL MAMMOGRAM WITH 3D TOMO WITH CAD

[R MLO]
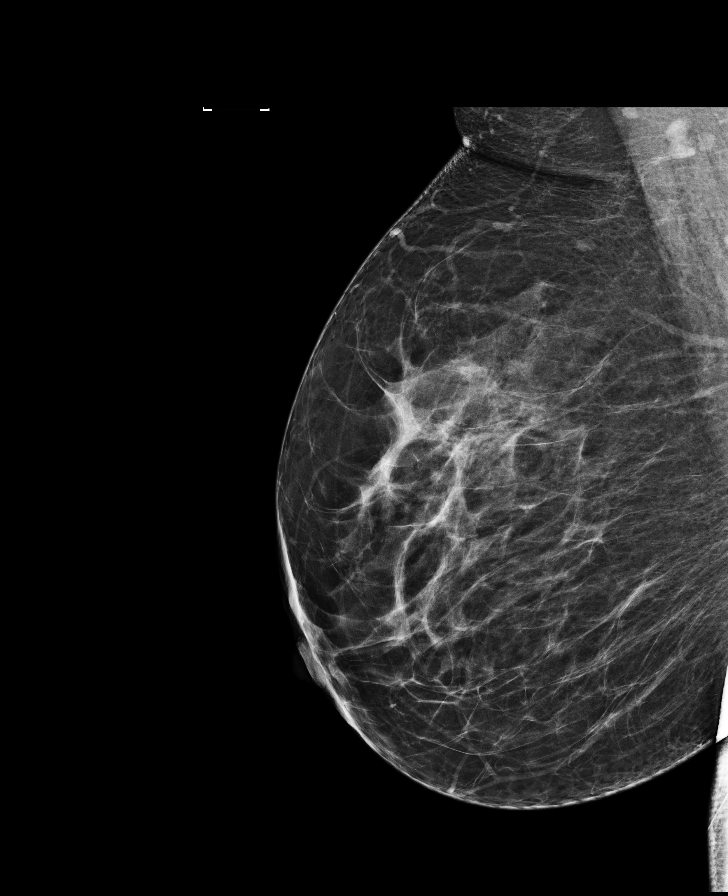

[L CC]
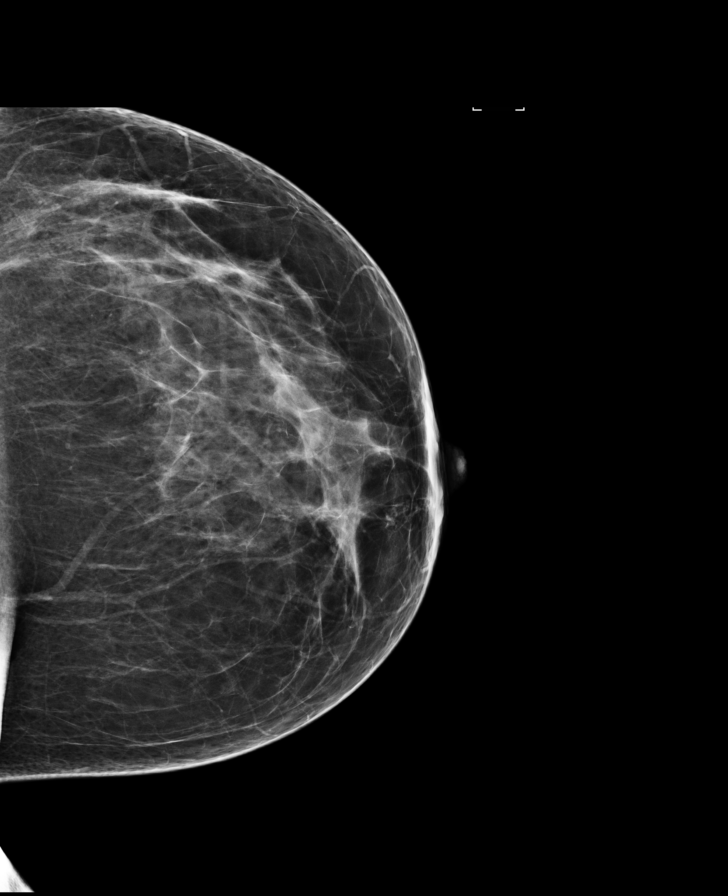

[L MLO]
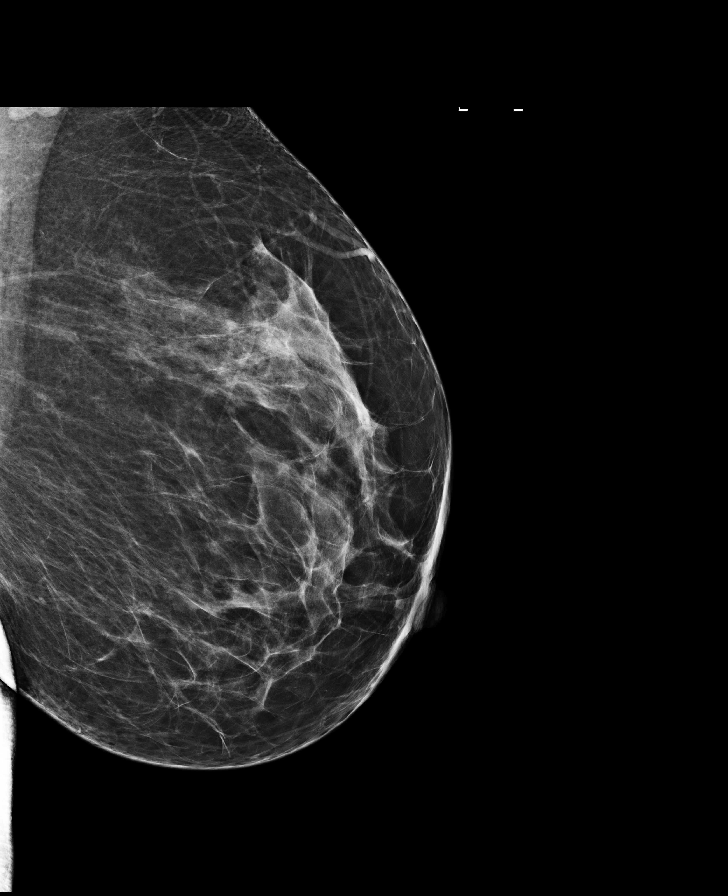

[R CC]
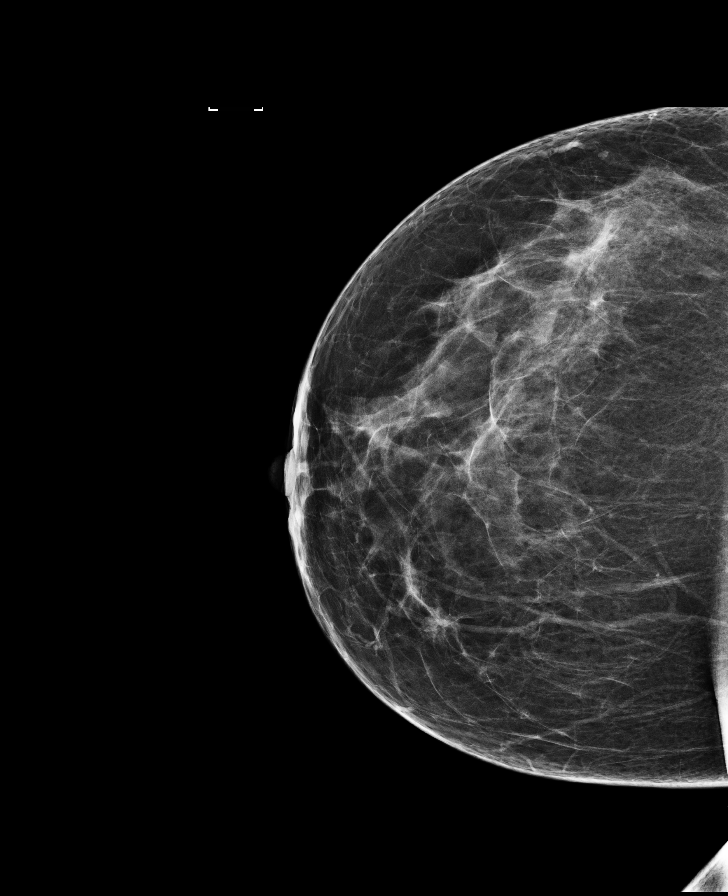

[L MLO tomo (1 of 2)]
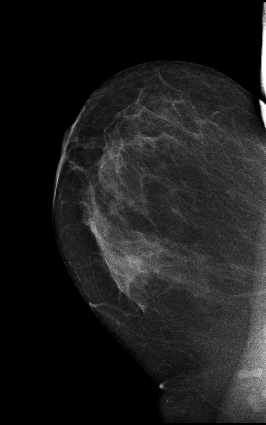

[L CC tomo (1 of 2)]
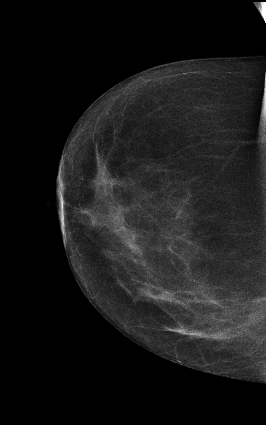

[R MLO tomo (1 of 2)]
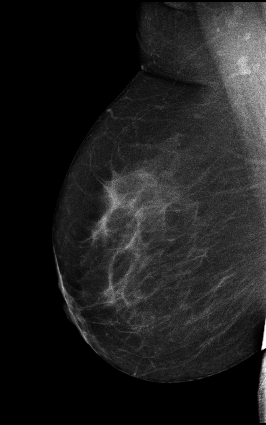

[R CC tomo (1 of 2)]
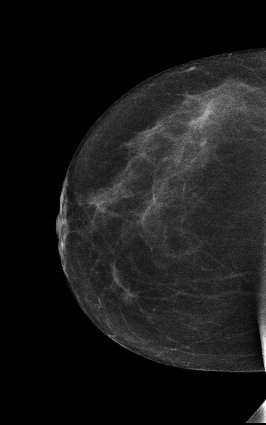

[R CC tomo (2 of 2) · tomo slice 37/73.0]
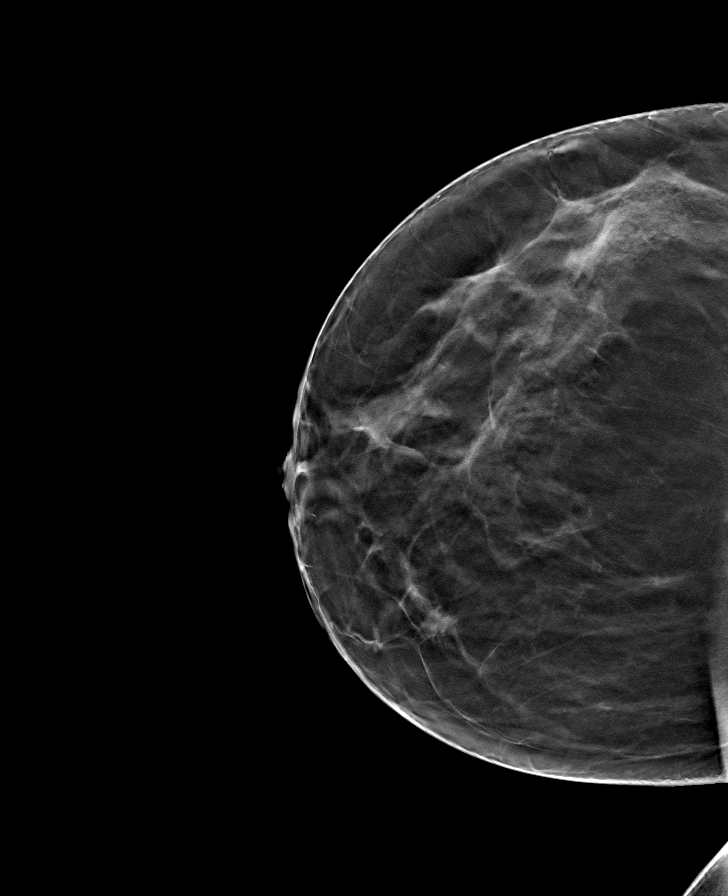

[R MLO tomo (2 of 2) · tomo slice 43/84.0]
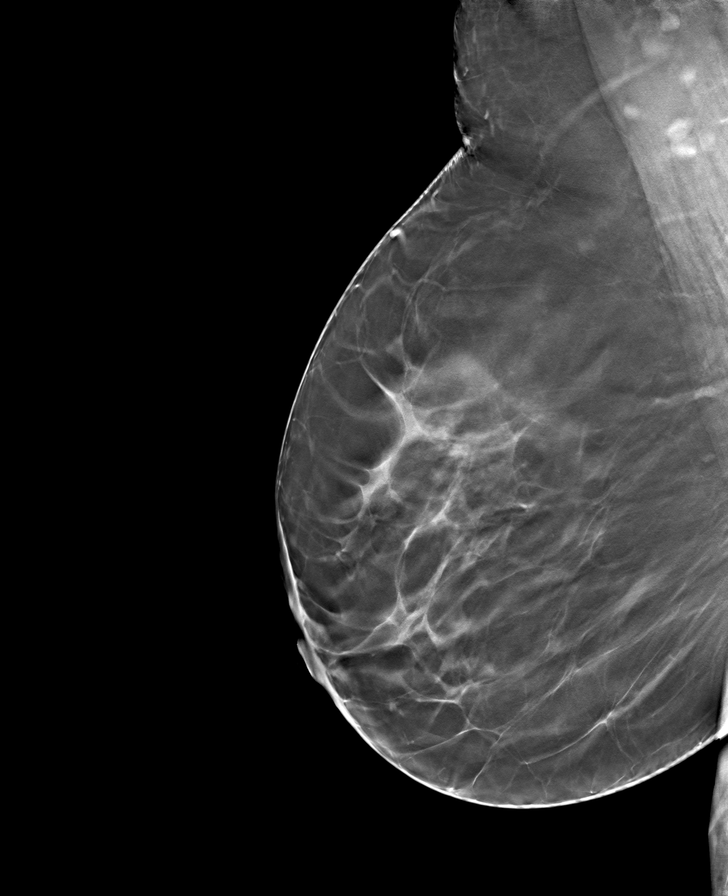

[L MLO tomo (2 of 2) · tomo slice 41/82.0]
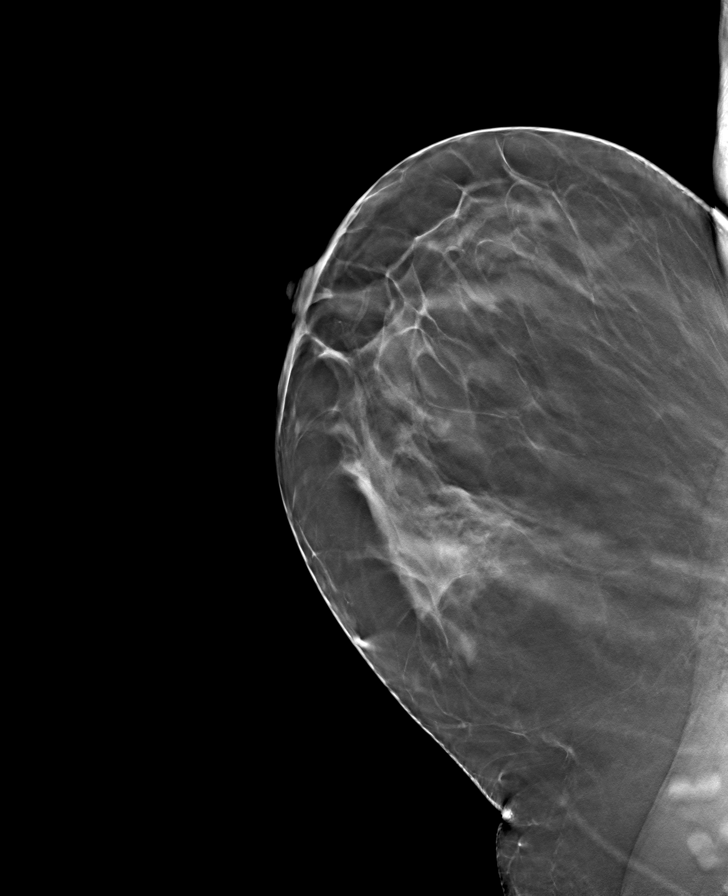

[L CC tomo (2 of 2) · tomo slice 36/71.0]
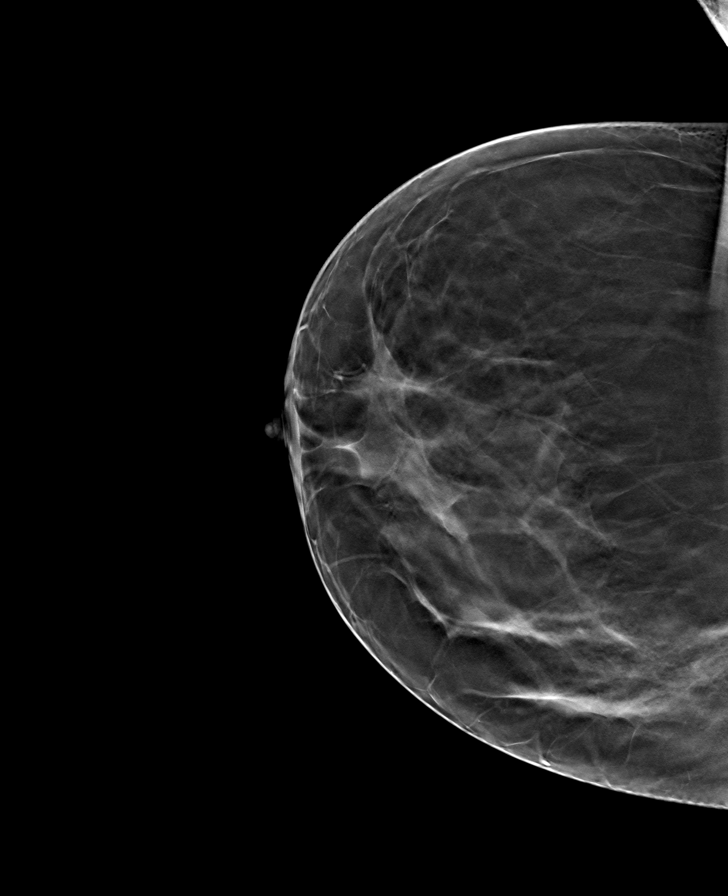

[12 of 28 positions shown; findings below may reference images not displayed]

ACR Breast Density Category b: There are scattered areas of
fibroglandular density.
FINDINGS: There are no findings suspicious for malignancy. Images were
processed with CAD.
IMPRESSION: No mammographic evidence of malignancy. A result letter of this
screening mammogram will be mailed directly to the patient.

RECOMMENDATION:
Screening mammogram in one year. (Code:55-L-23V)

BI-RADS CATEGORY  1: Negative.

## 2019-07-12 ENCOUNTER — Encounter: Payer: Self-pay | Admitting: Internal Medicine

## 2021-08-21 ENCOUNTER — Other Ambulatory Visit: Payer: Self-pay | Admitting: Urology

## 2021-08-21 DIAGNOSIS — N200 Calculus of kidney: Secondary | ICD-10-CM

## 2021-08-24 NOTE — Progress Notes (Signed)
Talked with patient. Arrival time 0800. Clear liquids until 0600, Husband will pick her up. Instructed to stop ibuprofen only take tylenol over the weekend. Encouraged to wear mask when ever out.

## 2021-08-27 ENCOUNTER — Other Ambulatory Visit: Payer: Self-pay

## 2021-08-27 ENCOUNTER — Encounter (HOSPITAL_BASED_OUTPATIENT_CLINIC_OR_DEPARTMENT_OTHER): Payer: Self-pay | Admitting: Urology

## 2021-08-27 ENCOUNTER — Ambulatory Visit (HOSPITAL_BASED_OUTPATIENT_CLINIC_OR_DEPARTMENT_OTHER)
Admission: RE | Admit: 2021-08-27 | Discharge: 2021-08-27 | Disposition: A | Discharge status: Home / Self Care | Payer: BC Managed Care – PPO | Attending: Urology | Admitting: Urology

## 2021-08-27 ENCOUNTER — Encounter (HOSPITAL_BASED_OUTPATIENT_CLINIC_OR_DEPARTMENT_OTHER)
Admission: RE | Disposition: A | Discharge status: Home / Self Care | Payer: Self-pay | Source: Home / Self Care | Attending: Urology

## 2021-08-27 ENCOUNTER — Ambulatory Visit (HOSPITAL_COMMUNITY): Payer: BC Managed Care – PPO

## 2021-08-27 DIAGNOSIS — N200 Calculus of kidney: Secondary | ICD-10-CM | POA: Insufficient documentation

## 2021-08-27 HISTORY — DX: Other complications of anesthesia, initial encounter: T88.59XA

## 2021-08-27 HISTORY — PX: EXTRACORPOREAL SHOCK WAVE LITHOTRIPSY: SHX1557

## 2021-08-27 LAB — POCT PREGNANCY, URINE: Preg Test, Ur: NEGATIVE

## 2021-08-27 IMAGING — DX DG ABDOMEN 1V
1 series · 1 of 1 positions shown · non-contrast
Comparison: 05/17/2021

CLINICAL DATA: Nephrolithiasis

EXAM:
ABDOMEN - 1 VIEW

[abdomen kub]
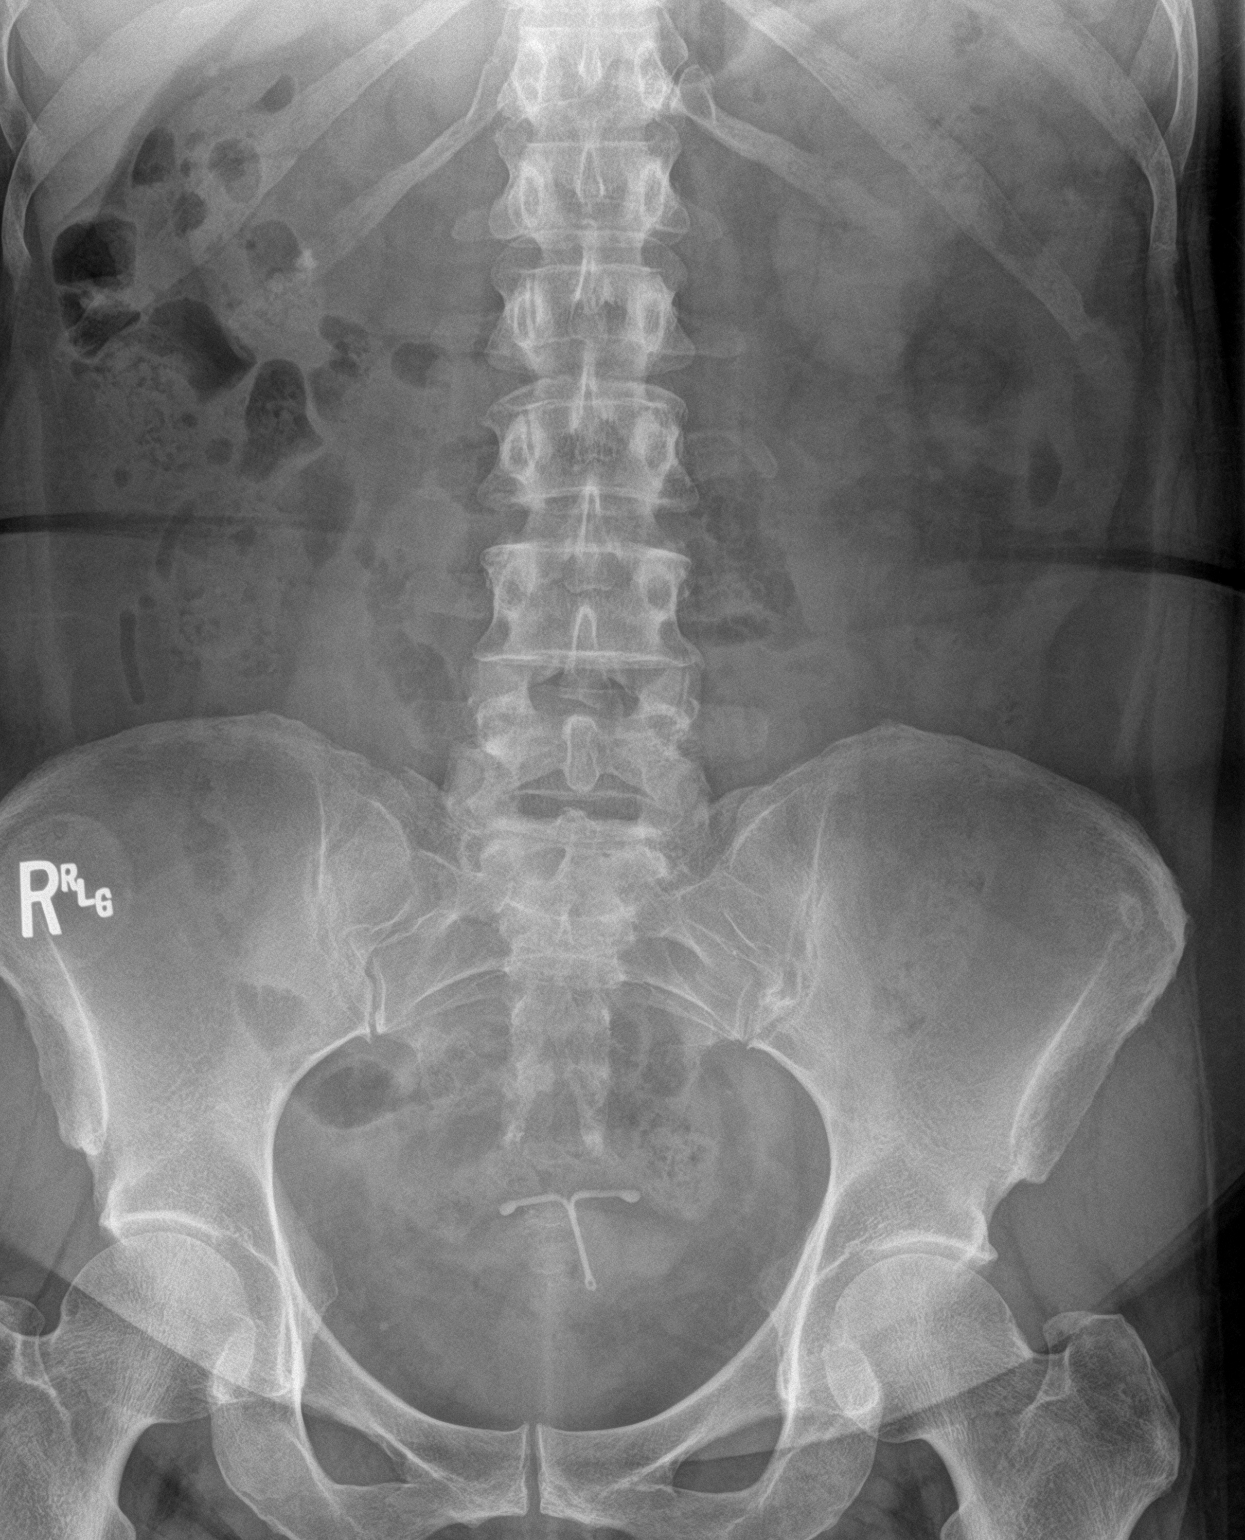

[1 of 1 positions shown; findings below may reference images not displayed]

FINDINGS: The previously noted 6 mm calculus overlying the expected right
renal pelvis has migrated in retrograde fashion now overlies the
lower pole of the right kidney. No additional nephro or
urolithiasis. Normal abdominal gas pattern. Intrauterine device in
expected position within the mid pelvis. No acute bone abnormality.
IMPRESSION: Retrograde migration of previously noted 6 mm calculus, now
overlying the lower pole the right kidney.

## 2021-08-27 SURGERY — LITHOTRIPSY, ESWL
Anesthesia: LOCAL | Laterality: Right

## 2021-08-27 MED ORDER — DIAZEPAM 5 MG PO TABS
10.0000 mg | ORAL_TABLET | ORAL | Status: AC
  Administered 2021-08-27: 10 mg via ORAL

## 2021-08-27 MED ORDER — CIPROFLOXACIN HCL 500 MG PO TABS
500.0000 mg | ORAL_TABLET | ORAL | Status: AC
  Administered 2021-08-27: 500 mg via ORAL

## 2021-08-27 MED ORDER — CIPROFLOXACIN HCL 500 MG PO TABS
ORAL_TABLET | ORAL | Status: AC
  Filled 2021-08-27: qty 1

## 2021-08-27 MED ORDER — TAMSULOSIN HCL 0.4 MG PO CAPS
0.4000 mg | ORAL_CAPSULE | Freq: Every day | ORAL | 0 refills | Status: AC
Start: 2021-08-27 — End: ?

## 2021-08-27 MED ORDER — DIAZEPAM 5 MG PO TABS
ORAL_TABLET | ORAL | Status: AC
  Filled 2021-08-27: qty 2

## 2021-08-27 MED ORDER — DIPHENHYDRAMINE HCL 25 MG PO CAPS
ORAL_CAPSULE | ORAL | Status: AC
  Filled 2021-08-27: qty 1

## 2021-08-27 MED ORDER — DIPHENHYDRAMINE HCL 25 MG PO CAPS
25.0000 mg | ORAL_CAPSULE | ORAL | Status: AC
  Administered 2021-08-27: 25 mg via ORAL

## 2021-08-27 MED ORDER — SODIUM CHLORIDE 0.9 % IV SOLN: INTRAVENOUS | Status: DC

## 2021-08-27 MED ORDER — HYDROCODONE-ACETAMINOPHEN 5-325 MG PO TABS: 1.0000 | ORAL_TABLET | ORAL | 0 refills | Status: AC | PRN

## 2021-08-27 MED ORDER — ONDANSETRON 4 MG PO TBDP: 4.0000 mg | ORAL_TABLET | Freq: Three times a day (TID) | ORAL | 0 refills | Status: AC | PRN

## 2021-08-27 NOTE — Op Note (Signed)
See [facility] OP note scanned into chart. Also because of the size, density, location and other factors that cannot be anticipated I feel this will likely be a staged procedure. This fact supersedes any indication in the scanned [facility] operative note to the contrary.  

## 2021-08-27 NOTE — Discharge Instructions (Addendum)
See [facility] discharge instructions in chart.    Post Anesthesia Home Care Instructions  Activity: Get plenty of rest for the remainder of the day. A responsible individual must stay with you for 24 hours following the procedure.  For the next 24 hours, DO NOT: -Drive a car -Operate machinery -Drink alcoholic beverages -Take any medication unless instructed by your physician -Make any legal decisions or sign important papers.  Meals: Start with liquid foods such as gelatin or soup. Progress to regular foods as tolerated. Avoid greasy, spicy, heavy foods. If nausea and/or vomiting occur, drink only clear liquids until the nausea and/or vomiting subsides. Call your physician if vomiting continues.      

## 2021-08-27 NOTE — H&P (Signed)
See HP scanned into Epic 7 mm right renal calculus

## 2021-08-28 ENCOUNTER — Encounter (HOSPITAL_BASED_OUTPATIENT_CLINIC_OR_DEPARTMENT_OTHER): Payer: Self-pay | Admitting: Urology

## 2021-08-30 NOTE — H&P (Signed)
See HP scanned into Epic 

## 2023-08-06 ENCOUNTER — Other Ambulatory Visit: Payer: Self-pay

## 2023-08-06 ENCOUNTER — Encounter (HOSPITAL_BASED_OUTPATIENT_CLINIC_OR_DEPARTMENT_OTHER): Payer: Self-pay | Admitting: Emergency Medicine

## 2023-08-06 ENCOUNTER — Emergency Department (HOSPITAL_BASED_OUTPATIENT_CLINIC_OR_DEPARTMENT_OTHER)
Admission: EM | Admit: 2023-08-06 | Discharge: 2023-08-06 | Disposition: A | Discharge status: Home / Self Care | Payer: BC Managed Care – PPO | Attending: Emergency Medicine | Admitting: Emergency Medicine

## 2023-08-06 DIAGNOSIS — Z23 Encounter for immunization: Secondary | ICD-10-CM | POA: Diagnosis not present

## 2023-08-06 DIAGNOSIS — Z203 Contact with and (suspected) exposure to rabies: Secondary | ICD-10-CM | POA: Diagnosis present

## 2023-08-06 MED ORDER — RABIES VACCINE, PCEC IM SUSR
1.0000 mL | Freq: Once | INTRAMUSCULAR | Status: AC
  Administered 2023-08-06: 1 mL via INTRAMUSCULAR
  Filled 2023-08-06: qty 1

## 2023-08-06 NOTE — Discharge Instructions (Signed)
With follow-up with primary care provider breakthrough symptoms and ER visit.  Today you received your first rabies shot and you will need to return in 3 days, 7 days, 14 days to have the rest of the shots.  You may take Tylenol after the shots if you are having bodyaches afterwards.  You may go to any [facility] urgent care to have these shots done.  If symptoms change or worsen please return to the ER.                                  RABIES VACCINE FOLLOW UP  Patient's Name: [name]                     Original Order Date:08/06/2023  Medical Record Number: [id number]  ED Physician: Maia Plan, MD Primary Diagnosis: Rabies Exposure       PCP: Pcp, No  Patient Phone Number: (home) [phone] (home)    (cell)  Telephone Information:  Mobile [phone]    (work) There is no work phone number on file. Species of Animal:     You have been seen in the Emergency Department for a possible rabies exposure. It's very important you return for the additional vaccine doses.  Please call the clinic listed below for hours of operation.   Clinic that will administer your rabies vaccines:    DAY 0:  08/06/2023      DAY 3:  08/09/2023       DAY 7:  08/13/2023     DAY 14:  08/20/2023         The 5th vaccine injection is considered for immune compromised patients only.  DAY 28:  09/03/2023

## 2023-08-06 NOTE — ED Provider Notes (Signed)
[facility] Provider Note   CSN: [id number] Arrival date & time: 08/06/23  1702     History  Chief Complaint  Patient presents with   exposure to rabies    [name] is a 52 y.o. female with no pertinent past medical history presented after exposure to rabies.  Patient states 6 days ago she found her 2 dogs biting into a raccoon and immediately took the dogs to get vaccinated at the vet and sent the raccoon off to the health department.  Patient received phone call today saying that there are can test positive for rabies and that if she had any saliva on her that she will need to have the rabies shots.  Patient has any wounds or bites but is unsure if her dogs have been licking her or if she got slide on her.  Patient denied a chest pain, shortness of breath, nauseous vomiting, altered mental status, trouble swallowing and is here only to have the vaccines.  Home Medications Prior to Admission medications   Medication Sig Start Date End Date Taking? Authorizing Provider  IBUPROFEN PO Take 2 tablets by mouth daily as needed (headache).    [provider]  levonorgestrel (MIRENA) 20 MCG/24HR IUD 1 each by Intrauterine route once.    [provider]  ondansetron (ZOFRAN ODT) 4 MG disintegrating tablet Take 1 tablet (4 mg total) by mouth every 8 (eight) hours as needed for nausea or vomiting. 08/27/21   Noel Christmas, MD  Pseudoephedrine-Ibuprofen 30-200 MG TABS Take 1 tablet by mouth daily as needed (sinus).    [provider]  tamsulosin (FLOMAX) 0.4 MG CAPS capsule Take 1 capsule (0.4 mg total) by mouth at bedtime. 08/27/21   Noel Christmas, MD      Allergies    Codeine    Review of Systems   Review of Systems  Physical Exam Updated Vital Signs BP (!) 196/99 (BP Location: Left Arm)   Pulse 77   Temp 98 F (36.7 C)   Resp 18   Wt 79.4 kg   SpO2 100%   BMI 32.01 kg/m  Physical  Exam Constitutional:      General: She is not in acute distress. HENT:     Head: Normocephalic and atraumatic.  Eyes:     Extraocular Movements: Extraocular movements intact.     Conjunctiva/sclera: Conjunctivae normal.     Pupils: Pupils are equal, round, and reactive to light.  Cardiovascular:     Rate and Rhythm: Normal rate and regular rhythm.     Pulses: Normal pulses.     Heart sounds: Normal heart sounds.  Pulmonary:     Effort: Pulmonary effort is normal.     Breath sounds: Normal breath sounds.  Abdominal:     Palpations: Abdomen is soft.     Tenderness: There is no abdominal tenderness. There is no guarding or rebound.  Musculoskeletal:        General: Normal range of motion.     Cervical back: Normal range of motion.  Skin:    General: Skin is warm and dry.     Comments: No signs of wounds or bite marks  Neurological:     Mental Status: She is alert.    ED Results / Procedures / Treatments   Labs (all labs ordered are listed, but only abnormal results are displayed) Labs Reviewed - No data to display  EKG None  Radiology No results found.  Procedures Procedures    Medications Ordered in ED Medications  rabies vaccine (RABAVERT) injection 1 mL (has no administration in time range)    ED Course/ Medical Decision Making/ A&P                                 Medical Decision Making Risk Prescription drug management.   [name] 53 y.o. presented today for rabies exposure. Working DDx that I considered at this time includes, but not limited to, rabies exposure, uncontrolled hypertension.  R/o DDx: Uncontrolled hypertension: These are considered less likely due to history of present illness, physical exam, labs/imaging findings  Review of prior external notes: 08/27/2021 discharge  Unique Tests and My Interpretation: None  Social Determinants of Health: none  Discussion with Independent Historian:  Husband  Discussion of Management of  Tests: None  Risk: Medium: prescription drug management  Risk Stratification Score: None  Staffed with Long, MD  Plan: On exam patient was no acute distress but was noted to be hypertensive 196/99.  Patient states that she is extremely anxious and that this is normal for her in the beginning of visits however when she is checked at time of discharge her blood pressure always normalized and so we will recheck it.  Patient does not have history of hypertension and is not on any blood pressure meds and denied any chest pain, headache, vision changes or shortness of breath that would indicate further workup for this at this time.  Patient is unsure if she was exposed to dog saliva and so due to having suspected encounter with rabies will initiate rabies series.  I spoke to the patient at length about how she needs to come back on days 3, 7, 14 to have the rest of the series completed and that today is considered day 0.  Patient does not have any wounds and did not get bit and so will not give immunoglobulin at this time.  First dose was ordered today.  At time of discharge patient's blood pressure had come down to 164/95 and patient was still asymptomatic.  Patient was given return precautions. Patient stable for discharge at this time.  Patient verbalized understanding of plan.  This chart was dictated using voice recognition software.  Despite best efforts to proofread,  errors can occur which can change the documentation meaning.         Final Clinical Impression(s) / ED Diagnoses Final diagnoses:  Rabies exposure    Rx / DC Orders ED Discharge Orders     None         Remi Deter 08/06/23 1806    Maia Plan, MD 08/12/23 1103

## 2023-08-06 NOTE — ED Notes (Signed)
Discharge instructions reviewed with patient. Patient verbalizes understanding, no further questions at this time. Medications and follow up information provided. No acute distress noted at time of departure.  

## 2023-08-06 NOTE — ED Triage Notes (Signed)
Pt reports her two dogs killed a raccoon last week , Heath department tested the racoon and was positive for Rabies , pt is here for rabies vaccine in case she was exposed to her dogs saliva .

## 2023-08-09 ENCOUNTER — Ambulatory Visit
Admission: RE | Admit: 2023-08-09 | Discharge: 2023-08-09 | Disposition: A | Discharge status: Home / Self Care | Payer: BC Managed Care – PPO | Source: Ambulatory Visit | Attending: Internal Medicine | Admitting: Internal Medicine

## 2023-08-09 DIAGNOSIS — Z203 Contact with and (suspected) exposure to rabies: Secondary | ICD-10-CM | POA: Diagnosis not present

## 2023-08-09 DIAGNOSIS — Z23 Encounter for immunization: Secondary | ICD-10-CM

## 2023-08-09 MED ORDER — RABIES VACCINE, PCEC IM SUSR
1.0000 mL | Freq: Once | INTRAMUSCULAR | Status: AC
  Administered 2023-08-09: 1 mL via INTRAMUSCULAR

## 2023-08-09 NOTE — ED Triage Notes (Signed)
Pt for rabies vaccine day 3-denies any issues-NAD-steady gait

## 2023-08-13 ENCOUNTER — Ambulatory Visit
Admission: RE | Admit: 2023-08-13 | Discharge: 2023-08-13 | Disposition: A | Discharge status: Home / Self Care | Payer: BC Managed Care – PPO | Source: Ambulatory Visit | Attending: Internal Medicine | Admitting: Internal Medicine

## 2023-08-13 DIAGNOSIS — Z23 Encounter for immunization: Secondary | ICD-10-CM

## 2023-08-13 DIAGNOSIS — Z203 Contact with and (suspected) exposure to rabies: Secondary | ICD-10-CM

## 2023-08-13 MED ORDER — RABIES VACCINE, PCEC IM SUSR
1.0000 mL | Freq: Once | INTRAMUSCULAR | Status: AC
  Administered 2023-08-13: 1 mL via INTRAMUSCULAR

## 2023-08-13 NOTE — ED Triage Notes (Signed)
Pt for rabies vaccine #3-denies issues

## 2023-08-20 ENCOUNTER — Ambulatory Visit
Admission: RE | Admit: 2023-08-20 | Discharge: 2023-08-20 | Disposition: A | Discharge status: Home / Self Care | Payer: BC Managed Care – PPO | Source: Ambulatory Visit | Attending: Internal Medicine | Admitting: Internal Medicine

## 2023-08-20 DIAGNOSIS — Z23 Encounter for immunization: Secondary | ICD-10-CM | POA: Diagnosis not present

## 2023-08-20 DIAGNOSIS — Z203 Contact with and (suspected) exposure to rabies: Secondary | ICD-10-CM | POA: Diagnosis not present

## 2023-08-20 MED ORDER — RABIES VACCINE, PCEC IM SUSR
1.0000 mL | Freq: Once | INTRAMUSCULAR | Status: AC
  Administered 2023-08-20: 1 mL via INTRAMUSCULAR

## 2023-08-20 NOTE — ED Triage Notes (Signed)
Pt here for day 14 of rabies vaccine. Denies concerns at this time.

## 2023-09-02 ENCOUNTER — Other Ambulatory Visit: Payer: Self-pay | Admitting: Obstetrics and Gynecology

## 2023-09-02 DIAGNOSIS — R928 Other abnormal and inconclusive findings on diagnostic imaging of breast: Secondary | ICD-10-CM

## 2023-09-18 ENCOUNTER — Ambulatory Visit
Admission: RE | Admit: 2023-09-18 | Discharge: 2023-09-18 | Disposition: A | Discharge status: Home / Self Care | Payer: BC Managed Care – PPO | Source: Ambulatory Visit | Attending: Obstetrics and Gynecology | Admitting: Obstetrics and Gynecology

## 2023-09-18 DIAGNOSIS — R928 Other abnormal and inconclusive findings on diagnostic imaging of breast: Secondary | ICD-10-CM

## 2024-01-05 ENCOUNTER — Other Ambulatory Visit: Payer: Self-pay | Admitting: Urology

## 2024-01-05 MED ORDER — BISACODYL 5 MG PO TBEC: 5.0000 mg | DELAYED_RELEASE_TABLET | Freq: Once | ORAL | Status: AC

## 2024-01-06 ENCOUNTER — Encounter (HOSPITAL_COMMUNITY): Payer: Self-pay | Admitting: Urology

## 2024-01-06 NOTE — H&P (Signed)
 cc: microscopic hematuria, urinary frequency/urgency   10/02/20: 53 year old woman referred for microscopic hematuria. She says blood was seen on her urinalysis twice. She also reports urinary frequency and urgency with urge incontinence. She also has mild stress urinary incontinence but is not as bad as the urge component. She wears 1-2 pads a day sometimes 3. She drinks approximately 120 oz of water a day. She denies gross hematuria or recurrent UTIs.    11/16/20: 53 year old woman with microscopic hematuria who had negative cystoscopy at last visit returns for follow-up after renal ultrasound. Renal bladder ultrasound shows bilateral nonobstructing calculi approximately 3 on the left and 1 on the right. Also has a simple 1.5 cm left lower pole exophytic renal cyst. Patient has start pelvic floor physical therapy which she thinks will be helpful for her urinary frequency and urgency but she has not been able to do the exercises as consistently as she would like. She has an appointment next week again. She would like to continue to try this prior to any other intervention.   01/18/21: 53 year old woman with a negative microscopic hematuria workup found have nonobstructing renal calculi also with urinary frequency and urgency. Patient was having improvement with pelvic floor physical therapy but it was hard to member to her exercises. In the interim, patient's father was sick and her husband was recently diagnosed with bladder cancer. He is scheduled for TURBT in [facility] next week.   05/17/21: 53 year old woman with a history of urolithiasis here for follow-up. KUB today shows a radiopaque stone 7 mm on the right side. She denies any pain or gross hematuria. Her husband is currently undergoing BCG treatments for bladder cancer.   08/20/21: 53 year old woman found to have 7 mm right renal calculus on hematuria work-up here for follow-up. At her last visit in August 2022 we decided to defer management of  stone because her husband was going through bladder cancer treatment. He is doing well with that however is also scheduled to have an FNA of his thyroid soon. Patient had limited right flank pain after cleaning her daughter's closet. It is since resolved.   01/05/2024: 53 year old woman with a history of urolithiasis developed acute onset left lower quadrant pain and nausea and vomiting several days ago. She then developed gross hematuria and was seen in urgent care and given Flomax and an antibiotic. She has not had any pain in the last 24 hours or nausea and vomiting. Urinalysis today in the office looks normal.     ALLERGIES: Codeine    MEDICATIONS: Tamsulosin HCl 0.4 MG Capsule  Advil Cold/Sinus  Immunity Vitamins  Ondansetron 4 MG Tablet Disintegrating  Sulfamethoxazole-Trimethoprim     GU PSH: Cystoscopy - 2021 ESWL - 08/27/2021     NON-GU PSH: No Non-GU PSH    GU PMH: Renal calculus - 09/11/2021, - 08/20/2021, Reviewed KUB findings with patient which confirms 7 mm side on the right. Previously seen stones on renal ultrasound do not show up well today and KUB from the left. Discussed with patient observation verses procedural intervention such as ESWL or ureteroscopy. Patient feels overwhelmed with life given husband's bladder cancer treatment I would like for school to start prior to scheduling anything. She will follow-up in 3-4 months to discuss., - 2022, I have given patient prescription for tamsulosin in the event that on for renal calculi becomes obstructing in the ureter. She knows to call the office if she develops intolerable pain., - 2022, - 2022 Mixed incontinence - 2022, Patient would  like to continue with pelvic floor physical therapy and really given a chance to work prior to any further intervention., - 2022, - 2022, - 2022, - 2021 Urinary Frequency - 2022, Patient would like to pay attention to her overall fluid intake and restart pelvic floor physical therapy after her  husband's bladder cancers been treated. Will hold off on any kind medication for now., - 2022, - 2022, - 2022, - 2022, - 2021 Urinary Urgency - 2022, - 2022, - 2022, - 2022, - 2022, - 2021 Microscopic hematuria - 2022, Microscopic hematuria workup revealed only bilateral nonobstructing renal calculi. There is approximately 3 stones on the left ranging from 4-6 mm of 1 stone on the right rectally 8 mm. Patient drinks approximately 120 oz of water a day and is a vegetarian. I did counsel her to cut back on salt intake. Will repeat KUB in 6 months and continue with observation for now. , - 2022, - 2021 Renal cyst, Reassured patient that simple renal cyst is not worrisome and requires no further follow-up. - 2022    NON-GU PMH: Muscle weakness (generalized) - 2022, - 2022, - 2022    FAMILY HISTORY: No Family History    SOCIAL HISTORY: Marital Status: Married Preferred Language: English; Ethnicity: Not Hispanic Or Latino; Race: White Current Smoking Status: Patient does not smoke anymore.   Tobacco Use Assessment Completed: Used Tobacco in last 30 days? Social Drinker.  Drinks 2 caffeinated drinks per day.    REVIEW OF SYSTEMS:    GU Review Female:   Patient denies frequent urination, hard to postpone urination, burning /pain with urination, get up at night to urinate, leakage of urine, stream starts and stops, trouble starting your stream, have to strain to urinate, and being pregnant.  Gastrointestinal (Upper):   Patient denies nausea, vomiting, and indigestion/ heartburn.  Gastrointestinal (Lower):   Patient denies diarrhea and constipation.  Constitutional:   Patient denies fever, night sweats, weight loss, and fatigue.  Skin:   Patient denies skin rash/ lesion and itching.  Eyes:   Patient denies blurred vision and double vision.  Ears/ Nose/ Throat:   Patient denies sore throat and sinus problems.  Hematologic/Lymphatic:   Patient denies swollen glands and easy bruising.   Cardiovascular:   Patient denies leg swelling and chest pains.  Respiratory:   Patient denies cough and shortness of breath.  Endocrine:   Patient denies excessive thirst.  Musculoskeletal:   Patient denies back pain and joint pain.  Neurological:   Patient denies headaches and dizziness.  Psychologic:   Patient denies depression and anxiety.   VITAL SIGNS:      01/05/2024 12:44 PM  BP 124/83 mmHg  Pulse 72 /min  Temperature 97.7 F / 36.5 C   GU PHYSICAL EXAMINATION:      Notes: No CVA tenderness to palpation bilaterally   MULTI-SYSTEM PHYSICAL EXAMINATION:    Constitutional: Well-nourished. No physical deformities. Normally developed. Good grooming.  Neck: Neck symmetrical, not swollen. Normal tracheal position.  Respiratory: No labored breathing, no use of accessory muscles.   Skin: No paleness, no jaundice, no cyanosis. No lesion, no ulcer, no rash.  Neurologic / Psychiatric: Oriented to time, oriented to place, oriented to person. No depression, no anxiety, no agitation.  Eyes: Normal conjunctivae. Normal eyelids.  Ears, Nose, Mouth, and Throat: Left ear no scars, no lesions, no masses. Right ear no scars, no lesions, no masses. Nose no scars, no lesions, no masses. Normal hearing. Normal lips.  Musculoskeletal: Normal gait  and station of head and neck.     Complexity of Data:  Records Review:   Previous Patient Records, POC Tool  Urine Test Review:   Urinalysis  X-Ray Review: C.T. Abdomen/Pelvis: Reviewed Films. Discussed With Patient. 5 mm right proximal ureteral calculus with mild hydronephrosis, 4 mm left UVJ versus recently passed calculus without hydronephrosis. 5 mm right sided stone visible on CT scout.    PROCEDURES:         C.T. ABD-Pelv w/o - 81191      Patient confirmed No Neulasta OnPro Device.         Urinalysis w/Scope Dipstick Dipstick Cont'd Micro  Color: Straw Bilirubin: Neg mg/dL WBC/hpf: 0 - 5/hpf  Appearance: Clear Ketones: Neg mg/dL RBC/hpf: 0 -  2/hpf  Specific Gravity: <=1.005 Blood: Trace ery/uL Bacteria: Few (10-25/hpf)  pH: 7.0 Protein: Neg mg/dL Cystals: NS (Not Seen)  Glucose: Neg mg/dL Urobilinogen: 0.2 mg/dL Casts: NS (Not Seen)    Nitrites: Neg Trichomonas: Not Present    Leukocyte Esterase: Neg leu/uL Mucous: Not Present      Epithelial Cells: 0 - 5/hpf      Yeast: NS (Not Seen)      Sperm: Not Present    ASSESSMENT:      ICD-10 Details  1 GU:   Ureteral calculus - N20.1 Acute, Uncomplicated  2   Renal calculus - N20.0 Chronic, Stable   PLAN:            Medications New Meds: Tamsulosin HCl 0.4 MG Capsule 1 capsule PO Q HS   #30  3 Refill(s)  Ondansetron 4 MG Tablet Disintegrating 1 tablet PO Q 8 H PRN nausea  #30  0 Refill(s)  traMADol HCl 1 tablet PO Q 8 H PRN kidney stone pain  #20  0 Refill(s)  Pharmacy Name:  [id number]  Address:  [address]   [place], [place] [id number]  Phone:  [phone]  Fax:  [phone]            Orders X-Rays: C.T. Abdomen/Pelvis Without I.V. Contrast  X-Ray Notes: History:   Hematuria: Yes / No   Patient to see MD after exam: Yes/ No   Previous exam:   When:   Where:   Diabetic: Yes / No   BUN/ Creatinine:   Date of last BUN Creatinine:   Weight in pounds:   Allergy- IV Contrast: Yes/ No  Prior Authorization #: [employer] State: NPCR            Schedule         Document Letter(s):  Created for Patient: Clinical Summary         Notes:   Urolithiasis:  -CT shows a recently passed burst left UPJ calculus as well as a right 5 mm proximal ureteral calculus  -Prescribed tamsulosin, tramadol and Zofran  -Patient is interested in moving forward with ESWL on the right side which she has done in the past. Risks and benefits of ESWL discussed with patient.  -She was given strict return precautions and have low threshold for urgent stent placement given the possibility of bilateral ureteral calculi   Schedule ESWL for this Friday  on the right side

## 2024-01-06 NOTE — Progress Notes (Signed)
 Spoke w/ via phone for pre-op interview--- Lab needs dos-KUB       Lab results------ COVID test not needed-patient states asymptomatic no test needed Arrive at -0645 NPO after MN NO Solid Food.  Clear liquids from MN until--- Pre-Surgery Ensure or G2:  Med rec completed Medications to take morning of surgery -If needed may take Tramadol, Tylenol, Zofran, Tamsulosin Diabetic medication -----  GLP1 agonist last dose: GLP1 instructions:  Patient instructed no nail polish to be worn day of surgery Patient instructed to bring photo id and insurance card day of surgery Patient aware to have Driver (ride ) / caregiver    for 24 hours after surgery - spouse- [employer] logistics/support/administrative officer Patient Special Instructions ----- Pre-Op special Instructions -per Litho  Patient verbalized understanding of instructions that were given at this phone interview. Patient denies chest pain, sob, fever, cough at the interview.

## 2024-01-07 NOTE — Progress Notes (Signed)
 Left voicemail for patient confirming arrival time to be 6:45 on Friday unless [facility] calls with other information.

## 2024-01-09 ENCOUNTER — Encounter (HOSPITAL_COMMUNITY)
Admission: RE | Disposition: A | Discharge status: Home / Self Care | Payer: Self-pay | Source: Home / Self Care | Attending: Urology

## 2024-01-09 ENCOUNTER — Other Ambulatory Visit: Payer: Self-pay

## 2024-01-09 ENCOUNTER — Ambulatory Visit (HOSPITAL_COMMUNITY)
Admission: RE | Admit: 2024-01-09 | Discharge: 2024-01-09 | Disposition: A | Discharge status: Home / Self Care | Attending: Urology | Admitting: Urology

## 2024-01-09 ENCOUNTER — Ambulatory Visit (HOSPITAL_COMMUNITY)

## 2024-01-09 DIAGNOSIS — N202 Calculus of kidney with calculus of ureter: Secondary | ICD-10-CM | POA: Diagnosis present

## 2024-01-09 HISTORY — PX: EXTRACORPOREAL SHOCK WAVE LITHOTRIPSY: SHX1557

## 2024-01-09 LAB — POCT PREGNANCY, URINE: Preg Test, Ur: NEGATIVE

## 2024-01-09 SURGERY — LITHOTRIPSY, ESWL
Anesthesia: LOCAL | Laterality: Right

## 2024-01-09 MED ORDER — TRAMADOL HCL 50 MG PO TABS: 50.0000 mg | ORAL_TABLET | Freq: Four times a day (QID) | ORAL | 0 refills | Status: AC | PRN

## 2024-01-09 MED ORDER — DIPHENHYDRAMINE HCL 25 MG PO CAPS
25.0000 mg | ORAL_CAPSULE | ORAL | Status: AC
  Administered 2024-01-09: 25 mg via ORAL
  Filled 2024-01-09: qty 1

## 2024-01-09 MED ORDER — CIPROFLOXACIN HCL 500 MG PO TABS
500.0000 mg | ORAL_TABLET | ORAL | Status: AC
  Administered 2024-01-09: 500 mg via ORAL
  Filled 2024-01-09: qty 1

## 2024-01-09 MED ORDER — TAMSULOSIN HCL 0.4 MG PO CAPS: 0.4000 mg | ORAL_CAPSULE | Freq: Every day | ORAL | 1 refills | Status: AC

## 2024-01-09 MED ORDER — SODIUM CHLORIDE 0.9 % IV SOLN: INTRAVENOUS | Status: DC

## 2024-01-09 MED ORDER — DIAZEPAM 5 MG PO TABS
10.0000 mg | ORAL_TABLET | ORAL | Status: AC
  Administered 2024-01-09: 10 mg via ORAL
  Filled 2024-01-09: qty 2

## 2024-01-09 NOTE — Discharge Instructions (Addendum)
 I have reviewed discharge instructions in detail with the patient. They will follow-up with me or their physician as scheduled. My nurse will also be calling the patients as per protocol.

## 2024-01-09 NOTE — Interval H&P Note (Signed)
 History and Physical Interval Note:  01/09/2024 7:18 AM  [name]  has presented today for surgery, with the diagnosis of RIGHT URETERAL CALCULUS.  The various methods of treatment have been discussed with the patient and family. After consideration of risks, benefits and other options for treatment, the patient has consented to  Procedure(s) with comments: LITHOTRIPSY, ESWL (Right) - RIGHT EXTRA CORPOREAL SHOCKWAVE LITHOTRIPSY as a surgical intervention.  The patient's history has been reviewed, patient examined, no change in status, stable for surgery.  I have reviewed the patient's chart and labs.  Questions were answered to the patient's satisfaction.     Philamena Kramar A Twania Bujak

## 2024-01-09 NOTE — Progress Notes (Signed)
 Reddened  blotchy area noted left flank from ESWL treatment.

## 2024-01-12 ENCOUNTER — Encounter (HOSPITAL_COMMUNITY): Payer: Self-pay | Admitting: Urology

## 2024-03-02 ENCOUNTER — Ambulatory Visit
Admission: RE | Admit: 2024-03-02 | Discharge: 2024-03-02 | Disposition: A | Discharge status: Home / Self Care | Payer: Self-pay | Source: Ambulatory Visit | Attending: Internal Medicine

## 2024-03-02 ENCOUNTER — Ambulatory Visit: Payer: Self-pay | Admitting: Internal Medicine

## 2024-03-02 ENCOUNTER — Ambulatory Visit (INDEPENDENT_AMBULATORY_CARE_PROVIDER_SITE_OTHER): Admitting: Radiology

## 2024-03-02 VITALS — BP 120/88 | HR 96 | Temp 98.5°F | Resp 17

## 2024-03-02 DIAGNOSIS — J209 Acute bronchitis, unspecified: Secondary | ICD-10-CM | POA: Diagnosis not present

## 2024-03-02 DIAGNOSIS — R0602 Shortness of breath: Secondary | ICD-10-CM

## 2024-03-02 DIAGNOSIS — R051 Acute cough: Secondary | ICD-10-CM

## 2024-03-02 MED ORDER — PROMETHAZINE-DM 6.25-15 MG/5ML PO SYRP: 5.0000 mL | ORAL_SOLUTION | Freq: Every evening | ORAL | 0 refills | Status: AC | PRN

## 2024-03-02 MED ORDER — ALBUTEROL SULFATE (2.5 MG/3ML) 0.083% IN NEBU
2.5000 mg | INHALATION_SOLUTION | Freq: Once | RESPIRATORY_TRACT | Status: AC
Start: 2024-03-02 — End: 2024-03-02
  Administered 2024-03-02: 2.5 mg via RESPIRATORY_TRACT

## 2024-03-02 MED ORDER — AEROCHAMBER PLUS FLO-VU MEDIUM MISC
1.0000 | Freq: Once | Status: AC
  Administered 2024-03-02: 1

## 2024-03-02 MED ORDER — PREDNISONE 20 MG PO TABS: 40.0000 mg | ORAL_TABLET | Freq: Every day | ORAL | 0 refills | Status: AC

## 2024-03-02 MED ORDER — ALBUTEROL SULFATE HFA 108 (90 BASE) MCG/ACT IN AERS
2.0000 | INHALATION_SPRAY | Freq: Once | RESPIRATORY_TRACT | Status: AC
  Administered 2024-03-02: 2 via RESPIRATORY_TRACT

## 2024-03-02 MED ORDER — METHYLPREDNISOLONE SODIUM SUCC 125 MG IJ SOLR
80.0000 mg | Freq: Once | INTRAMUSCULAR | Status: AC
  Administered 2024-03-02: 80 mg via INTRAMUSCULAR

## 2024-03-02 NOTE — Discharge Instructions (Addendum)
 You have bronchitis which is inflammation of the upper airways in your lungs due to a virus.   We will treat this with steroids starting tomorrow since we gave you the steroid shot in the clinic.  Do not take any NSAIDs with steroid pills (no ibuprofen , naproxen while taking steroid, this could cause stomach upset).   Use albuterol every 4-6 hours as needed for cough, shortness of breath, and wheezing.   Promethazine DM at bedtime as needed for cough- this will make you sleepy so only take at bedtime.   Use guaifenesin (plain mucinex) to break up congestion in nose/chest so that you are able to excrete easier. Drink plenty of fluids to stay well hydrated while taking mucinex so that it works well in the body.   If you develop any new or worsening symptoms or if your symptoms do not start to improve, please return here or follow-up with your primary care provider. If your symptoms are severe, please go to the emergency room.

## 2024-03-02 NOTE — ED Provider Notes (Signed)
 [facility]    CSN: [id number] Arrival date & time: 03/02/24  7829      History   Chief Complaint Chief Complaint  Patient presents with   Cough    Entered by patient    HPI LUCINDIA LEMLEY is a 53 y.o. female.   KRYSTENA REITTER is a 53 y.o. female presenting for chief complaint of cough, nasal congestion, and shortness of breath with coughing that started 1 week ago. Cough is minimally productive and she reports intermittent shortness of breath and bilateral chest tightness. She has had a few episodes of post-tussive emesis due to harsh cough. Denies nausea, diarrhea, abdominal pain, rash, and fever/chills. Denies recent antibiotic/steroid use, history of asthma/copd. She is a former smoker (quit 24-25 years ago). She has been taking mucinex-DM OTC for symptoms with minimal relief.    Cough   Past Medical History:  Diagnosis Date   Complication of anesthesia     There are no active problems to display for this patient.   Past Surgical History:  Procedure Laterality Date   CESAREAN SECTION     01/2000,  06/2003,  06/2007   EXTRACORPOREAL SHOCK WAVE LITHOTRIPSY Right 08/27/2021   Procedure: EXTRACORPOREAL SHOCK WAVE LITHOTRIPSY (ESWL);  Surgeon: Roxane Copp, MD;  Location: [facility];  Service: Urology;  Laterality: Right;   EXTRACORPOREAL SHOCK WAVE LITHOTRIPSY Right 01/09/2024   Procedure: LITHOTRIPSY, ESWL;  Surgeon: Erman Hayward, MD;  Location: [facility];  Service: Urology;  Laterality: Right;  RIGHT EXTRA CORPOREAL SHOCKWAVE LITHOTRIPSY    OB History   No obstetric history on file.      Home Medications    Prior to Admission medications   Medication Sig Start Date End Date Taking? Authorizing Provider  predniSONE (DELTASONE) 20 MG tablet Take 2 tablets (40 mg total) by mouth daily with breakfast for 5 days. 03/02/24 03/07/24 Yes Starlene Eaton, FNP  promethazine-dextromethorphan (PROMETHAZINE-DM) 6.25-15 MG/5ML syrup  Take 5 mLs by mouth at bedtime as needed for cough. 03/02/24  Yes Starlene Eaton, FNP  IBUPROFEN  PO Take 2 tablets by mouth daily as needed (headache).    [provider]  levonorgestrel (MIRENA) 20 MCG/24HR IUD 1 each by Intrauterine route once.    [provider]  ondansetron  (ZOFRAN  ODT) 4 MG disintegrating tablet Take 1 tablet (4 mg total) by mouth every 8 (eight) hours as needed for nausea or vomiting. 08/27/21   Pace, Maryellen D, MD  OVER THE COUNTER MEDICATION Take 1 capsule by mouth daily. Ultraflora IB    [provider]  Pseudoephedrine-Ibuprofen  30-200 MG TABS Take 1 tablet by mouth daily as needed (sinus).    [provider]  sulfamethoxazole-trimethoprim (BACTRIM DS) 800-160 MG tablet Take 1 tablet by mouth 2 (two) times daily.    [provider]  tamsulosin  (FLOMAX ) 0.4 MG CAPS capsule Take 1 capsule (0.4 mg total) by mouth at bedtime. 08/27/21   Pace, Maryellen D, MD  tamsulosin  (FLOMAX ) 0.4 MG CAPS capsule Take 1 capsule (0.4 mg total) by mouth daily after breakfast. 01/09/24   Erman Hayward, MD  traMADol  (ULTRAM ) 50 MG tablet Take 50 mg by mouth every 8 (eight) hours as needed.    [provider]  traMADol  (ULTRAM ) 50 MG tablet Take 1 tablet (50 mg total) by mouth every 6 (six) hours as needed. 01/09/24   Erman Hayward, MD    Family History No family history on file.  Social History Social History   Tobacco Use  Smoking status: Never   Smokeless tobacco: Never  Substance Use Topics   Alcohol use: No   Drug use: No     Allergies   Codeine   Review of Systems Review of Systems  Respiratory:  Positive for cough.   Per HPI   Physical Exam Triage Vital Signs ED Triage Vitals  Encounter Vitals Group     BP 03/02/24 0938 120/88     Systolic BP Percentile --      Diastolic BP Percentile --      Pulse Rate 03/02/24 0938 96     Resp 03/02/24 0938 17     Temp 03/02/24 0938 98.5 F (36.9 C)      Temp Source 03/02/24 0938 Oral     SpO2 03/02/24 0938 96 %     Weight --      Height --      Head Circumference --      Peak Flow --      Pain Score 03/02/24 0949 4     Pain Loc --      Pain Education --      Exclude from Growth Chart --    No data found.  Updated Vital Signs BP 120/88 (BP Location: Right Arm)   Pulse 96   Temp 98.5 F (36.9 C) (Oral)   Resp 17   SpO2 96%   Visual Acuity Right Eye Distance:   Left Eye Distance:   Bilateral Distance:    Right Eye Near:   Left Eye Near:    Bilateral Near:     Physical Exam Vitals and nursing note reviewed.  Constitutional:      Appearance: She is not ill-appearing or toxic-appearing.  HENT:     Head: Normocephalic and atraumatic.     Right Ear: Hearing, tympanic membrane, ear canal and external ear normal.     Left Ear: Hearing, tympanic membrane, ear canal and external ear normal.     Nose: Nose normal.     Mouth/Throat:     Lips: Pink.     Mouth: Mucous membranes are moist. No injury or oral lesions.     Dentition: Normal dentition.     Tongue: No lesions.     Pharynx: Oropharynx is clear. Uvula midline. No pharyngeal swelling, oropharyngeal exudate, posterior oropharyngeal erythema, uvula swelling or postnasal drip.     Tonsils: No tonsillar exudate.  Eyes:     General: Lids are normal. Vision grossly intact. Gaze aligned appropriately.     Extraocular Movements: Extraocular movements intact.     Conjunctiva/sclera: Conjunctivae normal.  Neck:     Trachea: Trachea and phonation normal.  Cardiovascular:     Rate and Rhythm: Normal rate and regular rhythm.     Heart sounds: Normal heart sounds, S1 normal and S2 normal.  Pulmonary:     Effort: Pulmonary effort is normal. No respiratory distress.     Breath sounds: Normal air entry. Wheezing (Expiratory wheezing to bilateral lower lung fields.) present. No rhonchi or rales.     Comments: Speaking in full sentences without increased respiratory effort or  difficulty. Harsh cough with audible wheeze elicited with deep inspiration.  Chest:     Chest wall: No tenderness.  Musculoskeletal:     Cervical back: Neck supple.     Right lower leg: No edema.     Left lower leg: No edema.  Lymphadenopathy:     Cervical: No cervical adenopathy.  Skin:    General: Skin is warm and dry.  Capillary Refill: Capillary refill takes less than 2 seconds.     Findings: No rash.  Neurological:     General: No focal deficit present.     Mental Status: She is alert and oriented to person, place, and time. Mental status is at baseline.     Cranial Nerves: No dysarthria or facial asymmetry.  Psychiatric:        Mood and Affect: Mood normal.        Speech: Speech normal.        Behavior: Behavior normal.        Thought Content: Thought content normal.        Judgment: Judgment normal.      UC Treatments / Results  Labs (all labs ordered are listed, but only abnormal results are displayed) Labs Reviewed - No data to display  EKG   Radiology No results found.  Procedures Procedures (including critical care time)  Medications Ordered in UC Medications  albuterol (PROVENTIL) (2.5 MG/3ML) 0.083% nebulizer solution 2.5 mg (2.5 mg Nebulization Given 03/02/24 0953)  albuterol (VENTOLIN HFA) 108 (90 Base) MCG/ACT inhaler 2 puff (2 puffs Inhalation Given 03/02/24 0953)  AeroChamber Plus Flo-Vu Medium MISC 1 each (1 each Other Given 03/02/24 0953)  methylPREDNISolone sodium succinate (SOLU-MEDROL) 125 mg/2 mL injection 80 mg (80 mg Intramuscular Given 03/02/24 1004)    Initial Impression / Assessment and Plan / UC Course  I have reviewed the triage vital signs and the nursing notes.  Pertinent labs & imaging results that were available during my care of the patient were reviewed by me and considered in my medical decision making (see chart for details).   1. Acute bronchitis, acute cough, shortness of breath Evaluation suggests viral bronchitis,  however chest x-ray ordered given clinical concern for underlying focal consolidation/pneumonia. Chest x-ray is unremarkable for signs of acute cardiopulmonary abnormality by my interpretation, no signs of pneumonia, pleural effusion, pneumothorax, etc on wet read. Staff will call if radiology re-read shows findings requiring change in treatment plan.   Interventions in clinic: Albuterol breathing treatment administered with improvement in lung sounds on reassessment and improvement in subjective shortness of breath.  Recommend treatment with steroid, bronchodilator, cough suppressants for symptomatic relief, and expectorants (mucinex) as needed- see AVS.  Counseled patient on potential for adverse effects with medications prescribed/recommended today, strict ER and return-to-clinic precautions discussed, patient verbalized understanding.    Final Clinical Impressions(s) / UC Diagnoses   Final diagnoses:  Acute cough  Acute bronchitis, unspecified organism  Shortness of breath     Discharge Instructions      You have bronchitis which is inflammation of the upper airways in your lungs due to a virus.   We will treat this with steroids starting tomorrow since we gave you the steroid shot in the clinic.  Do not take any NSAIDs with steroid pills (no ibuprofen , naproxen while taking steroid, this could cause stomach upset).   Use albuterol every 4-6 hours as needed for cough, shortness of breath, and wheezing.   Promethazine DM at bedtime as needed for cough- this will make you sleepy so only take at bedtime.   Use guaifenesin (plain mucinex) to break up congestion in nose/chest so that you are able to excrete easier. Drink plenty of fluids to stay well hydrated while taking mucinex so that it works well in the body.   If you develop any new or worsening symptoms or if your symptoms do not start to improve, please return here or follow-up  with your primary care provider. If your symptoms  are severe, please go to the emergency room.    ED Prescriptions     Medication Sig Dispense Auth. Provider   promethazine-dextromethorphan (PROMETHAZINE-DM) 6.25-15 MG/5ML syrup Take 5 mLs by mouth at bedtime as needed for cough. 118 mL Shella Devoid M, FNP   predniSONE (DELTASONE) 20 MG tablet Take 2 tablets (40 mg total) by mouth daily with breakfast for 5 days. 10 tablet Starlene Eaton, FNP      PDMP not reviewed this encounter.   Starlene Eaton, [place] 03/02/24 [phone]

## 2024-03-02 NOTE — ED Triage Notes (Signed)
Pt c/o cough for 1 week.

## 2024-07-16 ENCOUNTER — Other Ambulatory Visit: Payer: Self-pay | Admitting: Obstetrics and Gynecology

## 2024-07-16 DIAGNOSIS — Z1231 Encounter for screening mammogram for malignant neoplasm of breast: Secondary | ICD-10-CM

## 2024-09-21 ENCOUNTER — Ambulatory Visit

## 2024-10-22 ENCOUNTER — Ambulatory Visit
Admission: RE | Admit: 2024-10-22 | Discharge: 2024-10-22 | Disposition: A | Source: Ambulatory Visit | Attending: Obstetrics and Gynecology | Admitting: Obstetrics and Gynecology

## 2024-10-22 DIAGNOSIS — Z1231 Encounter for screening mammogram for malignant neoplasm of breast: Secondary | ICD-10-CM
